# Patient Record
Sex: Male | Born: 1975 | Race: Black or African American | Hispanic: No | Marital: Single | Smoking: Former smoker
Health system: Southern US, Community
[De-identification: ages and names within clinical notes are randomized; demographics above are authoritative.]

---

## 2010-07-04 ENCOUNTER — Emergency Department: Payer: Self-pay | Admitting: Emergency Medicine

## 2011-06-18 ENCOUNTER — Ambulatory Visit: Payer: Self-pay | Admitting: Internal Medicine

## 2013-01-21 HISTORY — PX: KNEE SURGERY: SHX244

## 2013-01-21 HISTORY — PX: LEG SURGERY: SHX1003

## 2015-08-11 ENCOUNTER — Inpatient Hospital Stay
Admission: EM | Admit: 2015-08-11 | Discharge: 2015-08-17 | DRG: 465 | Disposition: A | Discharge status: Home Health | Payer: Self-pay | Attending: Orthopedic Surgery | Admitting: Orthopedic Surgery

## 2015-08-11 ENCOUNTER — Encounter: Payer: Self-pay | Admitting: Emergency Medicine

## 2015-08-11 ENCOUNTER — Emergency Department: Payer: Self-pay

## 2015-08-11 DIAGNOSIS — B9561 Methicillin susceptible Staphylococcus aureus infection as the cause of diseases classified elsewhere: Secondary | ICD-10-CM | POA: Diagnosis present

## 2015-08-11 DIAGNOSIS — M00261 Other streptococcal arthritis, right knee: Principal | ICD-10-CM | POA: Diagnosis present

## 2015-08-11 DIAGNOSIS — M009 Pyogenic arthritis, unspecified: Secondary | ICD-10-CM | POA: Diagnosis present

## 2015-08-11 DIAGNOSIS — Z95828 Presence of other vascular implants and grafts: Secondary | ICD-10-CM

## 2015-08-11 DIAGNOSIS — M25461 Effusion, right knee: Secondary | ICD-10-CM

## 2015-08-11 DIAGNOSIS — L97509 Non-pressure chronic ulcer of other part of unspecified foot with unspecified severity: Secondary | ICD-10-CM

## 2015-08-11 DIAGNOSIS — L97511 Non-pressure chronic ulcer of other part of right foot limited to breakdown of skin: Secondary | ICD-10-CM | POA: Diagnosis present

## 2015-08-11 DIAGNOSIS — G6289 Other specified polyneuropathies: Secondary | ICD-10-CM | POA: Diagnosis present

## 2015-08-11 DIAGNOSIS — B951 Streptococcus, group B, as the cause of diseases classified elsewhere: Secondary | ICD-10-CM | POA: Diagnosis present

## 2015-08-11 DIAGNOSIS — S83104S Unspecified dislocation of right knee, sequela: Secondary | ICD-10-CM

## 2015-08-11 LAB — CBC WITH DIFFERENTIAL/PLATELET
BASOS ABS: 0.1 10*3/uL (ref 0–0.1)
Basophils Relative: 1 %
EOS ABS: 0 10*3/uL (ref 0–0.7)
EOS PCT: 0 %
HCT: 46.1 % (ref 40.0–52.0)
HEMOGLOBIN: 16.2 g/dL (ref 13.0–18.0)
LYMPHS ABS: 1.2 10*3/uL (ref 1.0–3.6)
LYMPHS PCT: 16 %
MCH: 31.5 pg (ref 26.0–34.0)
MCHC: 35 g/dL (ref 32.0–36.0)
MCV: 89.8 fL (ref 80.0–100.0)
Monocytes Absolute: 1 10*3/uL (ref 0.2–1.0)
Monocytes Relative: 13 %
NEUTROS PCT: 70 %
Neutro Abs: 5.3 10*3/uL (ref 1.4–6.5)
PLATELETS: 220 10*3/uL (ref 150–440)
RBC: 5.13 MIL/uL (ref 4.40–5.90)
RDW: 13.6 % (ref 11.5–14.5)
WBC: 7.5 10*3/uL (ref 3.8–10.6)

## 2015-08-11 LAB — BASIC METABOLIC PANEL
ANION GAP: 11 (ref 5–15)
BUN: 10 mg/dL (ref 6–20)
CALCIUM: 9.7 mg/dL (ref 8.9–10.3)
CHLORIDE: 95 mmol/L — AB (ref 101–111)
CO2: 28 mmol/L (ref 22–32)
CREATININE: 0.98 mg/dL (ref 0.61–1.24)
GFR calc Af Amer: >60 mL/min (ref >60)
Glucose, Bld: 96 mg/dL (ref 65–99)
POTASSIUM: 4 mmol/L (ref 3.5–5.1)
SODIUM: 134 mmol/L — AB (ref 135–145)

## 2015-08-11 LAB — SEDIMENTATION RATE: SED RATE: 29 mm/h — AB (ref 0–15)

## 2015-08-11 LAB — C-REACTIVE PROTEIN: CRP: 26.2 mg/dL — AB (ref <1.0)

## 2015-08-11 LAB — LACTIC ACID, PLASMA: Lactic Acid, Venous: 1.1 mmol/L (ref 0.5–1.9)

## 2015-08-11 MED ORDER — MORPHINE SULFATE (PF) 4 MG/ML IV SOLN
4.0000 mg | Freq: Once | INTRAVENOUS | Status: AC
  Administered 2015-08-11: 4 mg via INTRAVENOUS
  Filled 2015-08-11: qty 1

## 2015-08-11 NOTE — ED Notes (Signed)
MD at bedside. 

## 2015-08-11 NOTE — ED Notes (Signed)
Patient transported to X-ray 
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
## 2015-08-11 NOTE — ED Notes (Signed)
EMS vitals were 101.3 temperature, 121/76, 78 HR, 10/10 pain.
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
## 2015-08-12 ENCOUNTER — Inpatient Hospital Stay: Payer: Self-pay | Admitting: Anesthesiology

## 2015-08-12 ENCOUNTER — Inpatient Hospital Stay: Payer: Self-pay

## 2015-08-12 ENCOUNTER — Encounter: Payer: Self-pay | Admitting: Anesthesiology

## 2015-08-12 ENCOUNTER — Encounter
Admission: EM | Disposition: A | Discharge status: Home Health | Payer: Self-pay | Source: Home / Self Care | Attending: Orthopedic Surgery

## 2015-08-12 DIAGNOSIS — M009 Pyogenic arthritis, unspecified: Secondary | ICD-10-CM | POA: Diagnosis present

## 2015-08-12 HISTORY — PX: INCISION AND DRAINAGE: SHX5863

## 2015-08-12 LAB — SYNOVIAL CELL COUNT + DIFF, W/ CRYSTALS
CRYSTALS FLUID: NONE SEEN
Eosinophils-Synovial: 0 %
Lymphocytes-Synovial Fld: 1 %
Monocyte-Macrophage-Synovial Fluid: 3 %
NEUTROPHIL, SYNOVIAL: 96 %
OTHER CELLS-SYN: 0
WBC, SYNOVIAL: 132682 /mm3 — AB (ref 0–200)

## 2015-08-12 SURGERY — INCISION AND DRAINAGE
Anesthesia: General | Laterality: Right

## 2015-08-12 MED ORDER — OXYCODONE HCL 5 MG PO TABS
10.0000 mg | ORAL_TABLET | ORAL | Status: DC | PRN
  Administered 2015-08-12 (×2): 15 mg via ORAL
  Administered 2015-08-12: 5 mg via ORAL
  Administered 2015-08-12: 10 mg via ORAL
  Administered 2015-08-13 – 2015-08-15 (×13): 15 mg via ORAL
  Administered 2015-08-15: 10 mg via ORAL
  Administered 2015-08-15 – 2015-08-17 (×13): 15 mg via ORAL
  Filled 2015-08-12 (×14): qty 3
  Filled 2015-08-12: qty 1
  Filled 2015-08-12 (×2): qty 3
  Filled 2015-08-12: qty 2
  Filled 2015-08-12 (×4): qty 3
  Filled 2015-08-12: qty 2
  Filled 2015-08-12: qty 1
  Filled 2015-08-12: qty 2
  Filled 2015-08-12 (×9): qty 3

## 2015-08-12 MED ORDER — PROPOFOL 10 MG/ML IV BOLUS
INTRAVENOUS | Status: DC | PRN
  Administered 2015-08-12: 50 mg via INTRAVENOUS
  Administered 2015-08-12: 150 mg via INTRAVENOUS
  Administered 2015-08-12: 100 mg via INTRAVENOUS

## 2015-08-12 MED ORDER — VANCOMYCIN HCL IN DEXTROSE 1-5 GM/200ML-% IV SOLN
1000.0000 mg | Freq: Three times a day (TID) | INTRAVENOUS | Status: DC
  Administered 2015-08-12 – 2015-08-17 (×16): 1000 mg via INTRAVENOUS
  Filled 2015-08-12 (×17): qty 200

## 2015-08-12 MED ORDER — ACETAMINOPHEN 325 MG PO TABS
650.0000 mg | ORAL_TABLET | Freq: Four times a day (QID) | ORAL | Status: DC | PRN
  Administered 2015-08-17: 650 mg via ORAL
  Filled 2015-08-12: qty 2

## 2015-08-12 MED ORDER — LIDOCAINE HCL (CARDIAC) 20 MG/ML IV SOLN
INTRAVENOUS | Status: DC | PRN
  Administered 2015-08-12: 50 mg via INTRAVENOUS

## 2015-08-12 MED ORDER — ACETAMINOPHEN 10 MG/ML IV SOLN
INTRAVENOUS | Status: AC
  Filled 2015-08-12: qty 100

## 2015-08-12 MED ORDER — FENTANYL CITRATE (PF) 100 MCG/2ML IJ SOLN
INTRAMUSCULAR | Status: DC | PRN
  Administered 2015-08-12 (×3): 50 ug via INTRAVENOUS

## 2015-08-12 MED ORDER — MAGNESIUM CITRATE PO SOLN
1.0000 | Freq: Once | ORAL | Status: AC | PRN
  Administered 2015-08-15: 1 via ORAL
  Filled 2015-08-12 (×2): qty 296

## 2015-08-12 MED ORDER — ONDANSETRON HCL 4 MG/2ML IJ SOLN: 4.0000 mg | Freq: Four times a day (QID) | INTRAMUSCULAR | Status: DC | PRN

## 2015-08-12 MED ORDER — NEOMYCIN-POLYMYXIN B GU 40-200000 IR SOLN
Status: AC
  Filled 2015-08-12: qty 20

## 2015-08-12 MED ORDER — LIDOCAINE 5 % EX PTCH
1.0000 | MEDICATED_PATCH | CUTANEOUS | Status: DC
  Administered 2015-08-13 – 2015-08-17 (×5): 1 via TRANSDERMAL
  Filled 2015-08-12 (×6): qty 1

## 2015-08-12 MED ORDER — HYDROMORPHONE HCL 1 MG/ML IJ SOLN
1.0000 mg | INTRAMUSCULAR | Status: DC | PRN
  Administered 2015-08-12 – 2015-08-17 (×9): 1 mg via INTRAVENOUS
  Filled 2015-08-12 (×10): qty 1

## 2015-08-12 MED ORDER — ACETAMINOPHEN 325 MG PO TABS: 650.0000 mg | ORAL_TABLET | Freq: Four times a day (QID) | ORAL | Status: DC | PRN

## 2015-08-12 MED ORDER — BISACODYL 10 MG RE SUPP: 10.0000 mg | Freq: Every day | RECTAL | Status: DC | PRN

## 2015-08-12 MED ORDER — PIPERACILLIN-TAZOBACTAM 3.375 G IVPB
3.3750 g | Freq: Three times a day (TID) | INTRAVENOUS | Status: DC
  Administered 2015-08-12: 3.375 g via INTRAVENOUS
  Filled 2015-08-12 (×3): qty 50

## 2015-08-12 MED ORDER — METOCLOPRAMIDE HCL 10 MG PO TABS: 5.0000 mg | ORAL_TABLET | Freq: Three times a day (TID) | ORAL | Status: DC | PRN

## 2015-08-12 MED ORDER — DEXTROSE 5 % IV SOLN
2.0000 g | INTRAVENOUS | Status: DC
  Administered 2015-08-12 – 2015-08-17 (×6): 2 g via INTRAVENOUS
  Filled 2015-08-12 (×6): qty 2

## 2015-08-12 MED ORDER — VANCOMYCIN HCL IN DEXTROSE 1-5 GM/200ML-% IV SOLN
1000.0000 mg | INTRAVENOUS | Status: AC
  Administered 2015-08-12: 1000 mg via INTRAVENOUS
  Filled 2015-08-12: qty 200

## 2015-08-12 MED ORDER — POLYETHYLENE GLYCOL 3350 17 G PO PACK
17.0000 g | PACK | Freq: Every day | ORAL | Status: DC | PRN
Start: 2015-08-12 — End: 2015-08-17

## 2015-08-12 MED ORDER — DEXAMETHASONE SODIUM PHOSPHATE 10 MG/ML IJ SOLN
INTRAMUSCULAR | Status: DC | PRN
  Administered 2015-08-12: 5 mg via INTRAVENOUS

## 2015-08-12 MED ORDER — METOCLOPRAMIDE HCL 5 MG/ML IJ SOLN: 5.0000 mg | Freq: Three times a day (TID) | INTRAMUSCULAR | Status: DC | PRN

## 2015-08-12 MED ORDER — FENTANYL CITRATE (PF) 100 MCG/2ML IJ SOLN
INTRAMUSCULAR | Status: AC
  Filled 2015-08-12: qty 2

## 2015-08-12 MED ORDER — METHOCARBAMOL 1000 MG/10ML IJ SOLN
500.0000 mg | Freq: Four times a day (QID) | INTRAVENOUS | Status: DC | PRN
  Filled 2015-08-12: qty 5

## 2015-08-12 MED ORDER — SENNA 8.6 MG PO TABS
1.0000 | ORAL_TABLET | Freq: Two times a day (BID) | ORAL | Status: DC
  Administered 2015-08-12 – 2015-08-16 (×9): 8.6 mg via ORAL
  Filled 2015-08-12 (×10): qty 1

## 2015-08-12 MED ORDER — GLYCOPYRROLATE 0.2 MG/ML IJ SOLN
INTRAMUSCULAR | Status: DC | PRN
  Administered 2015-08-12: 0.2 mg via INTRAVENOUS

## 2015-08-12 MED ORDER — LACTATED RINGERS IV SOLN
INTRAVENOUS | Status: DC | PRN
  Administered 2015-08-12: 08:00:00 via INTRAVENOUS

## 2015-08-12 MED ORDER — VANCOMYCIN HCL IN DEXTROSE 1-5 GM/200ML-% IV SOLN
1000.0000 mg | Freq: Two times a day (BID) | INTRAVENOUS | Status: DC
  Filled 2015-08-12: qty 200

## 2015-08-12 MED ORDER — ENOXAPARIN SODIUM 40 MG/0.4ML ~~LOC~~ SOLN
40.0000 mg | SUBCUTANEOUS | Status: DC
  Administered 2015-08-13 – 2015-08-17 (×5): 40 mg via SUBCUTANEOUS
  Filled 2015-08-12 (×5): qty 0.4

## 2015-08-12 MED ORDER — ACETAMINOPHEN 650 MG RE SUPP: 650.0000 mg | Freq: Four times a day (QID) | RECTAL | Status: DC | PRN

## 2015-08-12 MED ORDER — ONDANSETRON HCL 4 MG PO TABS: 4.0000 mg | ORAL_TABLET | Freq: Four times a day (QID) | ORAL | Status: DC | PRN

## 2015-08-12 MED ORDER — NEOMYCIN-POLYMYXIN B GU 40-200000 IR SOLN
Status: DC | PRN
  Administered 2015-08-12: 28 mL

## 2015-08-12 MED ORDER — MORPHINE SULFATE (PF) 2 MG/ML IV SOLN
2.0000 mg | INTRAVENOUS | Status: DC | PRN
Start: 2015-08-12 — End: 2015-08-12
  Administered 2015-08-12: 2 mg via INTRAVENOUS
  Filled 2015-08-12: qty 1

## 2015-08-12 MED ORDER — MORPHINE SULFATE (PF) 4 MG/ML IV SOLN
4.0000 mg | Freq: Once | INTRAVENOUS | Status: AC
  Administered 2015-08-12: 4 mg via INTRAVENOUS
  Filled 2015-08-12: qty 1

## 2015-08-12 MED ORDER — DOCUSATE SODIUM 100 MG PO CAPS
100.0000 mg | ORAL_CAPSULE | Freq: Two times a day (BID) | ORAL | Status: DC
  Administered 2015-08-12 – 2015-08-16 (×9): 100 mg via ORAL
  Filled 2015-08-12 (×10): qty 1

## 2015-08-12 MED ORDER — DIPHENHYDRAMINE HCL 25 MG PO CAPS: 25.0000 mg | ORAL_CAPSULE | Freq: Four times a day (QID) | ORAL | Status: DC | PRN

## 2015-08-12 MED ORDER — FENTANYL CITRATE (PF) 100 MCG/2ML IJ SOLN
25.0000 ug | INTRAMUSCULAR | Status: DC | PRN
  Administered 2015-08-12 (×4): 25 ug via INTRAVENOUS

## 2015-08-12 MED ORDER — SODIUM CHLORIDE 0.9 % IV SOLN
INTRAVENOUS | Status: DC
  Administered 2015-08-12 – 2015-08-15 (×4): via INTRAVENOUS

## 2015-08-12 MED ORDER — VANCOMYCIN HCL IN DEXTROSE 1-5 GM/200ML-% IV SOLN: 1000.0000 mg | Freq: Two times a day (BID) | INTRAVENOUS | Status: DC

## 2015-08-12 MED ORDER — GABAPENTIN 300 MG PO CAPS
300.0000 mg | ORAL_CAPSULE | Freq: Three times a day (TID) | ORAL | Status: DC
  Administered 2015-08-12 – 2015-08-17 (×17): 300 mg via ORAL
  Filled 2015-08-12 (×17): qty 1

## 2015-08-12 MED ORDER — DIPHENHYDRAMINE HCL 12.5 MG/5ML PO ELIX: 12.5000 mg | ORAL_SOLUTION | ORAL | Status: DC | PRN

## 2015-08-12 MED ORDER — MIDAZOLAM HCL 2 MG/2ML IJ SOLN
INTRAMUSCULAR | Status: DC | PRN
  Administered 2015-08-12 (×2): 1 mg via INTRAVENOUS

## 2015-08-12 MED ORDER — ONDANSETRON HCL 4 MG/2ML IJ SOLN: 4.0000 mg | Freq: Once | INTRAMUSCULAR | Status: DC | PRN

## 2015-08-12 MED ORDER — ACETAMINOPHEN 10 MG/ML IV SOLN
INTRAVENOUS | Status: DC | PRN
  Administered 2015-08-12: 1000 mg via INTRAVENOUS

## 2015-08-12 MED ORDER — OXYCODONE HCL 5 MG PO TABS: 10.0000 mg | ORAL_TABLET | ORAL | Status: DC | PRN

## 2015-08-12 MED ORDER — METHOCARBAMOL 500 MG PO TABS
500.0000 mg | ORAL_TABLET | Freq: Four times a day (QID) | ORAL | Status: DC | PRN
  Administered 2015-08-14 – 2015-08-17 (×7): 500 mg via ORAL
  Filled 2015-08-12 (×7): qty 1

## 2015-08-12 SURGICAL SUPPLY — 52 items
BANDAGE ELASTIC 6 CLIP NS LF (GAUZE/BANDAGES/DRESSINGS) ×3 IMPLANT
BLADE SURG 11 STRL SS SAFETY (MISCELLANEOUS) ×6 IMPLANT
BNDG COHESIVE 6X5 TAN STRL LF (GAUZE/BANDAGES/DRESSINGS) ×3 IMPLANT
CANISTER SUCT 1200ML W/VALVE (MISCELLANEOUS) IMPLANT
CANISTER SUCT 3000ML (MISCELLANEOUS) ×9 IMPLANT
CAST PADDING 6X4YD ST 30248 (SOFTGOODS) ×2
CUFF TOURN 18 STER (MISCELLANEOUS) IMPLANT
CUFF TOURN 24 STER (MISCELLANEOUS) ×3 IMPLANT
DRAPE INCISE IOBAN 66X60 STRL (DRAPES) ×3 IMPLANT
DRAPE SHEET LG 3/4 BI-LAMINATE (DRAPES) ×3 IMPLANT
DRAPE SURG 17X11 SM STRL (DRAPES) IMPLANT
DRAPE U-SHAPE 47X51 STRL (DRAPES) ×3 IMPLANT
DURAPREP 26ML APPLICATOR (WOUND CARE) ×6 IMPLANT
ELECT CAUTERY BLADE 6.4 (BLADE) ×3 IMPLANT
ELECT REM PT RETURN 9FT ADLT (ELECTROSURGICAL) ×3
ELECTRODE REM PT RTRN 9FT ADLT (ELECTROSURGICAL) ×1 IMPLANT
GAUZE PETRO XEROFOAM 1X8 (MISCELLANEOUS) ×3 IMPLANT
GAUZE SPONGE 4X4 12PLY STRL (GAUZE/BANDAGES/DRESSINGS) ×3 IMPLANT
GAUZE XEROFORM 4X4 STRL (GAUZE/BANDAGES/DRESSINGS) ×6 IMPLANT
GLOVE BIOGEL PI IND STRL 9 (GLOVE) ×1 IMPLANT
GLOVE BIOGEL PI INDICATOR 9 (GLOVE) ×2
GLOVE SURG 9.0 ORTHO LTXF (GLOVE) ×9 IMPLANT
GOWN STRL REUS TWL 2XL XL LVL4 (GOWN DISPOSABLE) ×3 IMPLANT
GOWN STRL REUS W/ TWL LRG LVL3 (GOWN DISPOSABLE) ×1 IMPLANT
GOWN STRL REUS W/TWL LRG LVL3 (GOWN DISPOSABLE) ×2
HANDPIECE INTERPULSE COAX TIP (DISPOSABLE) ×2
HEMOVAC 400ML (MISCELLANEOUS)
IMMBOLIZER KNEE 19 BLUE UNIV (SOFTGOODS) ×3 IMPLANT
KIT DRAIN HEMOVAC JP 7FR 400ML (MISCELLANEOUS) IMPLANT
KIT RM TURNOVER STRD PROC AR (KITS) ×3 IMPLANT
NDL SAFETY 18GX1.5 (NEEDLE) ×3 IMPLANT
NEEDLE FILTER BLUNT 18X 1/2SAF (NEEDLE) ×2
NEEDLE FILTER BLUNT 18X1 1/2 (NEEDLE) ×1 IMPLANT
NS IRRIG 500ML POUR BTL (IV SOLUTION) ×3 IMPLANT
PACK EXTREMITY ARMC (MISCELLANEOUS) ×3 IMPLANT
PAD ABD DERMACEA PRESS 5X9 (GAUZE/BANDAGES/DRESSINGS) ×3 IMPLANT
PADDING CAST 4IN STRL (MISCELLANEOUS) ×4
PADDING CAST BLEND 4X4 STRL (MISCELLANEOUS) ×2 IMPLANT
PADDING CAST COTTON 6X4 ST (SOFTGOODS) ×1 IMPLANT
SET HNDPC FAN SPRY TIP SCT (DISPOSABLE) ×1 IMPLANT
SOL .9 NS 3000ML IRR  AL (IV SOLUTION) ×4
SOL .9 NS 3000ML IRR UROMATIC (IV SOLUTION) ×2 IMPLANT
SPONGE LAP 18X18 5 PK (GAUZE/BANDAGES/DRESSINGS) ×6 IMPLANT
STAPLER SKIN PROX 35W (STAPLE) ×6 IMPLANT
SUT ETHIBOND #5 BRAIDED 30INL (SUTURE) IMPLANT
SUT PDS AB 1 CT1 27 (SUTURE) ×6 IMPLANT
SUT TICRON 2-0 30IN 311381 (SUTURE) ×6 IMPLANT
SUT VIC AB 0 CT1 27 (SUTURE)
SUT VIC AB 0 CT1 27XCR 8 STRN (SUTURE) IMPLANT
SUT VIC AB 1 CTX 27 (SUTURE) IMPLANT
SYRINGE 10CC LL (SYRINGE) ×3 IMPLANT
TAPE MICROFOAM 4IN (TAPE) IMPLANT

## 2015-08-12 NOTE — Anesthesia Procedure Notes (Signed)
Procedure Name: LMA Insertion Performed by: Ginger Carne Pre-anesthesia Checklist: Patient identified, Emergency Drugs available, Suction available, Patient being monitored and Timeout performed Patient Re-evaluated:Patient Re-evaluated prior to inductionOxygen Delivery Method: Circle system utilized Preoxygenation: Pre-oxygenation with 100% oxygen Intubation Type: IV induction LMA: LMA inserted LMA Size: 4.5 Tube type: Oral Number of attempts: 1 Placement Confirmation: positive ETCO2 and breath sounds checked- equal and bilateral Tube secured with: Tape Dental Injury: Teeth and Oropharynx as per pre-operative assessment

## 2015-08-12 NOTE — Transfer of Care (Signed)
Immediate Anesthesia Transfer of Care Note  Patient: Nathaniel Rivers  Procedure(s) Performed: Procedure(s): INCISION AND DRAINAGE (Right)  Patient Location: PACU  Anesthesia Type:General  Level of Consciousness: sedated  Airway & Oxygen Therapy: Patient Spontanous Breathing and Patient connected to face mask oxygen  Post-op Assessment: Report given to RN and Post -op Vital signs reviewed and stable  Post vital signs: Reviewed and stable  Last Vitals:  Filed Vitals:   08/12/15 0426 08/12/15 1005  BP: 120/68 151/103  Pulse: 60 86  Temp: 37.7 C 36.1 C  Resp: 18 14    Last Pain:  Filed Vitals:   08/12/15 1006  PainSc: 0-No pain         Complications: No apparent anesthesia complications

## 2015-08-12 NOTE — OR Nursing (Signed)
Patient h appears to have left discomfort at this time

## 2015-08-12 NOTE — ED Provider Notes (Addendum)
-----------------------------------------   1:41 AM on 08/12/2015 -----------------------------------------   Blood pressure 117/80, pulse 65, temperature 100.2 F (37.9 C), temperature source Oral, resp. rate 18, height 5\' 10"  (1.778 m), weight 165 lb (74.844 kg), SpO2 95 %.  Assuming care from Dr. Darden Dates.  In short, Nathaniel Rivers is a 40 y.o. male with a chief complaint of Knee Pain .  Refer to the original H&P for additional details.  The current plan of care is to follow up the results of the cell count.  The patient's CRP returned at 26.2. The patient synovial fluid cell count and diff shows a turbid appearance with no crystal seen and WBC 132682.  I asked the patient where he had his previous surgeries and he said [facility] and [facility] but the patient at this time is refusing to go to [facility] and does not want to be transferred.   I contacted Dr. Martha Clan who reports that he will put in orders for the patient and will set him up for a wash out.    Rebecka Apley, MD 08/12/15 0144  Rebecka Apley, MD 08/12/15 [phone]
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
## 2015-08-12 NOTE — Progress Notes (Signed)
Pt refusing to have leg elevated on pillow.
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
## 2015-08-12 NOTE — Progress Notes (Signed)
Spoke with Dr. Ninetta Lights from infectious disease at [facility]. He is providing phone consultation but is not in-house. I discussed Mr. Guadiana's case with him. Dr. Ninetta Lights understands the patient just underwent open washout of the right knee. There was gross purulent fluid in the right knee. I requested antibiotic recommendations from Dr. Ninetta Lights. He has recommended vancomycin and ceftriaxone until final cultures are available. He states the patient will need long-term IV antibiotics and will need a PICC line. I will contact Dr. Ninetta Lights again after final cultures are available.

## 2015-08-12 NOTE — Anesthesia Preprocedure Evaluation (Signed)
Anesthesia Evaluation  Patient identified by MRN, date of birth, ID band Patient awake    Reviewed: Allergy & Precautions, NPO status , Patient's Chart, lab work & pertinent test results, reviewed documented beta blocker date and time   Airway Mallampati: II  TM Distance: >3 FB     Dental  (+) Chipped   Pulmonary former smoker,           Cardiovascular      Neuro/Psych    GI/Hepatic   Endo/Other    Renal/GU      Musculoskeletal   Abdominal   Peds  Hematology   Anesthesia Other Findings   Reproductive/Obstetrics                             Anesthesia Physical Anesthesia Plan  ASA: II  Anesthesia Plan: General   Post-op Pain Management:    Induction: Intravenous  Airway Management Planned: LMA  Additional Equipment:   Intra-op Plan:   Post-operative Plan:   Informed Consent: I have reviewed the patients History and Physical, chart, labs and discussed the procedure including the risks, benefits and alternatives for the proposed anesthesia with the patient or authorized representative who has indicated his/her understanding and acceptance.     Plan Discussed with: CRNA  Anesthesia Plan Comments:         Anesthesia Quick Evaluation  

## 2015-08-12 NOTE — Anesthesia Postprocedure Evaluation (Signed)
Anesthesia Post Note  Patient: Nathaniel Rivers  Procedure(s) Performed: Procedure(s) (LRB): INCISION AND DRAINAGE (Right)  Patient location during evaluation: PACU Anesthesia Type: General Level of consciousness: awake and alert Pain management: pain level controlled Vital Signs Assessment: post-procedure vital signs reviewed and stable Respiratory status: spontaneous breathing, nonlabored ventilation, respiratory function stable and patient connected to nasal cannula oxygen Cardiovascular status: blood pressure returned to baseline and stable Postop Assessment: no signs of nausea or vomiting Anesthetic complications: no    Last Vitals:  Filed Vitals:   08/12/15 1455 08/12/15 1543  BP: 108/76 105/64  Pulse: 50 52  Temp: 36.7 C 36.5 C  Resp: 18 18    Last Pain:  Filed Vitals:   08/12/15 1544  PainSc: 7                  Rishika Mccollom S

## 2015-08-12 NOTE — Progress Notes (Signed)
Pt sent to the OR via bed by OR orderly
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
## 2015-08-12 NOTE — Progress Notes (Signed)
Pharmacy Antibiotic Note  Nathaniel Rivers is a 40 y.o. male admitted on 08/11/2015 with Septic joint .  Pharmacy has been consulted for vancomcyin dosing. Patient is also receiving ceftriaxone daily per ID recommendation until cultures are available.   Plan: Ke: 0.092   T1/2:  7.53   Vd: 52.5   CrCl: 153ml/min   Scr: 0.95  Goal Trough 15-20  Will start patient on Vancomycin 1gm IV every 8 hours. Calculated trough at Css is 17.3. Will order trough level prior to 5th dose and continue to monitor renal function.    Height: 5\' 10"  (177.8 cm) Weight: 165 lb (74.844 kg) IBW/kg (Calculated) : 73  Temp (24hrs), Avg:99.2 F (37.3 C), Min:97 F (36.1 C), Max:100.2 F (37.9 C)   Recent Labs Lab 08/11/15 2112 08/11/15 2135  WBC  --  7.5  CREATININE  --  0.98  LATICACIDVEN 1.1  --     Estimated Creatinine Clearance: 106.6 mL/min (by C-G formula based on Cr of 0.98).    No Known Allergies  Antimicrobials this admission: 7/22 Vancomycin  >>  7/22 Ceftriaxone >>   Dose adjustments this admission:   Microbiology results: Body fluid Cx: pending  Aerobic/Anaerobic wound culture: pending   Thank you for allowing pharmacy to be a part of this patient's care.  Cher Nakai, PharmD Clinical Pharmacist 08/12/2015 11:37 AM
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
## 2015-08-13 LAB — COMPREHENSIVE METABOLIC PANEL
ALBUMIN: 3.1 g/dL — AB (ref 3.5–5.0)
ALK PHOS: 61 U/L (ref 38–126)
ALT: 15 U/L — ABNORMAL LOW (ref 17–63)
ANION GAP: 6 (ref 5–15)
AST: 21 U/L (ref 15–41)
BUN: 7 mg/dL (ref 6–20)
CALCIUM: 9.1 mg/dL (ref 8.9–10.3)
CHLORIDE: 97 mmol/L — AB (ref 101–111)
CO2: 31 mmol/L (ref 22–32)
Creatinine, Ser: 0.82 mg/dL (ref 0.61–1.24)
GFR calc non Af Amer: >60 mL/min (ref >60)
GLUCOSE: 122 mg/dL — AB (ref 65–99)
POTASSIUM: 4.7 mmol/L (ref 3.5–5.1)
SODIUM: 134 mmol/L — AB (ref 135–145)
Total Bilirubin: 1 mg/dL (ref 0.3–1.2)
Total Protein: 7.2 g/dL (ref 6.5–8.1)

## 2015-08-13 LAB — VANCOMYCIN, TROUGH: Vancomycin Tr: 14 ug/mL — ABNORMAL LOW (ref 15–20)

## 2015-08-13 MED ORDER — COLLAGENASE 250 UNIT/GM EX OINT
TOPICAL_OINTMENT | Freq: Every day | CUTANEOUS | Status: DC
  Administered 2015-08-13 – 2015-08-14 (×2): via TOPICAL
  Administered 2015-08-15: 1 via TOPICAL
  Administered 2015-08-16 – 2015-08-17 (×2): via TOPICAL
  Filled 2015-08-13: qty 30

## 2015-08-13 NOTE — NC FL2 (Signed)
[place] MEDICAID FL2 LEVEL OF CARE SCREENING TOOL     IDENTIFICATION  Patient Name: Nathaniel Rivers Birthdate: 1975-12-14 Sex: male Admission Date (Current Location): 08/11/2015  [place] and [place] Number:  Chiropodist and Address:  [facility], [address] [place], [place] [address]      Provider Number: [id number]  Attending Physician Name and Address:  Juanell Fairly, MD  Relative Name and Phone Number:       Current Level of Care: Hospital Recommended Level of Care: Skilled Nursing Facility Prior Approval Number:    Date Approved/Denied:   PASRR Number:  [id number])  Discharge Plan: SNF    Current Diagnoses: Patient Active Problem List   Diagnosis Date Noted  . Septic joint of right knee joint (HCC) 08/12/2015    Orientation RESPIRATION BLADDER Height & Weight     Self, Time, Situation, Place  Normal Continent Weight: 165 lb (74.8 kg) Height:  5\' 10"  (177.8 cm)  BEHAVIORAL SYMPTOMS/MOOD NEUROLOGICAL BOWEL NUTRITION STATUS   (None)  (None) Continent Diet (Regular)  AMBULATORY STATUS COMMUNICATION OF NEEDS Skin   Extensive Assist Verbally Other (Comment) (Closed Incision- Right Leg; Closed Incision- Right Toe; Closed System Drain- Right Knee Accordion (Hemovac) 10 Fr. )                       Personal Care Assistance Level of Assistance  Bathing, Feeding, Dressing Bathing Assistance: Limited assistance Feeding assistance: Independent Dressing Assistance: Limited assistance     Functional Limitations Info  Sight, Hearing, Speech Sight Info: Adequate Hearing Info: Adequate Speech Info: Adequate    SPECIAL CARE FACTORS FREQUENCY  PT (By licensed PT)     PT Frequency:  (5)              Contractures      Additional Factors Info  Code Status, Allergies Code Status Info:  (Full Code) Allergies Info:  (No Known Allergies)           Current Medications (08/13/2015):  This is the current hospital  active medication list Current Facility-Administered Medications  Medication Dose Route Frequency Provider Last Rate Last Dose  . 0.9 %  sodium chloride infusion   Intravenous Continuous Juanell Fairly, MD 75 mL/hr at 08/12/15 1723    . acetaminophen (TYLENOL) tablet 650 mg  650 mg Oral Q6H PRN Juanell Fairly, MD       Or  . acetaminophen (TYLENOL) suppository 650 mg  650 mg Rectal Q6H PRN Juanell Fairly, MD      . bisacodyl (DULCOLAX) suppository 10 mg  10 mg Rectal Daily PRN Juanell Fairly, MD      . cefTRIAXone (ROCEPHIN) 2 g in dextrose 5 % 50 mL IVPB  2 g Intravenous Q24H Juanell Fairly, MD   2 g at 08/13/15 1049  . collagenase (SANTYL) ointment   Topical Daily Gwyneth Revels, DPM      . diphenhydrAMINE (BENADRYL) 12.5 MG/5ML elixir 12.5-25 mg  12.5-25 mg Oral Q4H PRN Juanell Fairly, MD      . docusate sodium (COLACE) capsule 100 mg  100 mg Oral BID Juanell Fairly, MD   100 mg at 08/13/15 0934  . enoxaparin (LOVENOX) injection 40 mg  40 mg Subcutaneous Q24H Juanell Fairly, MD   40 mg at 08/13/15 0742  . gabapentin (NEURONTIN) capsule 300 mg  300 mg Oral TID Juanell Fairly, MD   300 mg at 08/13/15 0934  . HYDROmorphone (DILAUDID) injection 1 mg  1 mg Intravenous  Q2H PRN Kevin Krasinski, MD   1 mg at 08/13/15 0742  . lidocaine (LIDODERM) 5 % 1 patch  Juanell Fairlysdermal Q24H Juanell Fairly, MD   1 patch at 08/13/15 1159  . magnesium citrate solution 1 Bottle  1 Bottle Oral Once PRN Juanell Fairly, MD      . methocarbamol (ROBAXIN) tablet 500 mg  500 mg Oral Q6H PRN Juanell Fairly, MD       Or  . methocarbamol (ROBAXIN) 500 mg in dextrose 5 % 50 mL IVPB  500 mg Intravenous Q6H PRN Juanell Fairly, MD      . metoCLOPramide (REGLAN) tablet 5-10 mg  5-10 mg Oral Q8H PRN Juanell Fairly, MD       Or  . metoCLOPramide (REGLAN) injection 5-10 mg  5-10 mg Intravenous Q8H PRN Juanell Fairly, MD      . ondansetron [facility]) tablet 4 mg  4 mg Oral Q6H PRN Juanell Fairly, MD       Or  .  ondansetron [facility]) injection 4 mg  4 mg Intravenous Q6H PRN Juanell Fairly, MD      . oxyCODONE (Oxy IR/ROXICODONE) immediate release tablet 10-15 mg  10-15 mg Oral Q3H PRN Juanell Fairly, MD   15 mg at 08/13/15 1007  . polyethylene glycol (MIRALAX / GLYCOLAX) packet 17 g  17 g Oral Daily PRN Juanell Fairly, MD      . senna [facility]) tablet 8.6 mg  1 tablet Oral BID Juanell Fairly, MD   8.6 mg at 08/13/15 0934  . vancomycin (VANCOCIN) IVPB 1000 mg/200 mL premix  1,000 mg Intravenous Q8H Sheema M Hallaji, RPH   1,000 mg at 08/13/15 1159     Discharge Medications: Please see discharge summary for a list of discharge medications.  Relevant Imaging Results:  Relevant Lab Results:   Additional Information  (SSN [id number])  Verta Ellen Dorion Petillo, LCSW
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
## 2015-08-13 NOTE — Progress Notes (Signed)
F/U right great toe ulcer. No breakthrough bleeding to bandage. Xray negative for osteomyelitis.  Low likelihood for osteo at this time. Will order santyl dressing and instructed pt to perform this at home. F/U with me in 2 weeks outpt clinic.
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
## 2015-08-13 NOTE — Progress Notes (Addendum)
Pharmacy Antibiotic Note  Nathaniel Rivers is a 40 y.o. male admitted on 08/11/2015 with Septic joint .  Pharmacy has been consulted for vancomcyin dosing. Patient is also receiving ceftriaxone daily per ID recommendation until cultures are available.   Plan: Ke: 0.092   T1/2:  7.53   Vd: 52.5   CrCl: 127ml/min   Scr: 0.95  Goal Trough 15-20  Will start patient on Vancomycin 1gm IV every 8 hours. Calculated trough at Css is 17.3. Will order trough level prior to 5th dose and continue to monitor renal function.   7/23 19:30 Vanc trough resulted at 3mcg/ml, will continue with current dosing for now.  Height: 5\' 10"  (177.8 cm) Weight: 165 lb (74.8 kg) IBW/kg (Calculated) : 73  Temp (24hrs), Avg:99 F (37.2 C), Min:98.5 F (36.9 C), Max:99.6 F (37.6 C)   Recent Labs Lab 08/11/15 2112 08/11/15 2135 08/13/15 0518 08/13/15 1925  WBC  --  7.5  --   --   CREATININE  --  0.98 0.82  --   LATICACIDVEN 1.1  --   --   --   VANCOTROUGH  --   --   --  14*    Estimated Creatinine Clearance: 127.4 mL/min (by C-G formula based on SCr of 0.82 mg/dL).    No Known Allergies  Antimicrobials this admission: 7/22 Vancomycin  >>  7/22 Ceftriaxone >>   Dose adjustments this admission:   Microbiology results: Body fluid Cx: pending  Aerobic/Anaerobic wound culture: pending   Thank you for allowing pharmacy to be a part of this patient's care.  Clovia Cuff, PharmD, BCPS 08/13/2015 9:10 PM

## 2015-08-14 ENCOUNTER — Encounter: Payer: Self-pay | Admitting: Orthopedic Surgery

## 2015-08-14 ENCOUNTER — Inpatient Hospital Stay: Payer: Self-pay

## 2015-08-14 LAB — COMPREHENSIVE METABOLIC PANEL
ALBUMIN: 3.2 g/dL — AB (ref 3.5–5.0)
ALT: 22 U/L (ref 17–63)
AST: 32 U/L (ref 15–41)
Alkaline Phosphatase: 66 U/L (ref 38–126)
Anion gap: 6 (ref 5–15)
BUN: 7 mg/dL (ref 6–20)
CHLORIDE: 96 mmol/L — AB (ref 101–111)
CO2: 32 mmol/L (ref 22–32)
Calcium: 9.1 mg/dL (ref 8.9–10.3)
Creatinine, Ser: 0.94 mg/dL (ref 0.61–1.24)
GFR calc Af Amer: >60 mL/min (ref >60)
GLUCOSE: 129 mg/dL — AB (ref 65–99)
POTASSIUM: 4.2 mmol/L (ref 3.5–5.1)
Sodium: 134 mmol/L — ABNORMAL LOW (ref 135–145)
Total Bilirubin: 1.2 mg/dL (ref 0.3–1.2)
Total Protein: 7.1 g/dL (ref 6.5–8.1)

## 2015-08-14 LAB — BODY FLUID CULTURE

## 2015-08-14 NOTE — Evaluation (Signed)
Occupational Therapy Evaluation Patient Details Name: Nathaniel Rivers MRN: [id number] DOB: 10/05/1975 Today's Date: 08/14/2015    History of Present Illness Patient is a 40 y.o. male admitted on 12 August 2015 with edema and swelling in the right knee. Pt. wears an AFO at home, however has rubbed a sore area on this right big toe. Pt. has a RW/manual wheelchair at home.   Clinical Impression   Pt. Is a 40 y.o. Male who was admitted to [facility] with edema and swelling in the right knee. Pt. presents with pain in the right knee, weakness, and decreased functional mobility for ADLs which hinder his ability to complete ADL tasks. Pt. could benefit from skilled OT services for ADL and IADL training, A/E training, UE there. Ex, functional mobility for ADLs, and home modifications in order to improve ADL and IADL functioning to return to his PLOF.    Follow Up Recommendations  No OT follow up    Equipment Recommendations  Tub/shower bench    Recommendations for Other Services Rehab consult     Precautions / Restrictions Precautions Precautions: Fall Required Braces or Orthoses: Knee Immobilizer - Right Knee Immobilizer - Right: On at all times Restrictions Weight Bearing Restrictions: No      Mobility                                    General transfer comment: Per PT pt. is ModA sit to stand, and mIn guard SPT.    Balance                                            ADL Overall ADL's : Needs assistance/impaired Eating/Feeding: Set up   Grooming: Set up               Lower Body Dressing: Maximal assistance (RLE, Pt. is indpendent with LLE.)                       Vision     Perception     Praxis      Pertinent Vitals/Pain Pain Assessment: 0-10 Pain Score: 10-Worst pain ever Pain Location: Right knee Pain Intervention(s): Limited activity within patient's tolerance     Hand Dominance     Extremity/Trunk Assessment Upper  Extremity Assessment Upper Extremity Assessment: Overall WFL for tasks assessed     Communication     Cognition                           General Comments       Exercises       Shoulder Instructions      Home Living Family/patient expects to be discharged to:: Private residence Living Arrangements: Alone Available Help at Discharge: Neighbor;Available PRN/intermittently Type of Home: House Home Access: Level entry     Home Layout: One level     Bathroom Shower/Tub: Tub/shower unit Shower/tub characteristics: Engineer, building services: Standard Bathroom Accessibility: Yes How Accessible: Accessible via walker Home Equipment: Walker - 2 wheels;Walker - standard;Wheelchair - manual          Prior Functioning/Environment Level of Independence: Needs assistance  Gait / Transfers Assistance Needed: Patient reports modified independence for household ambulation with walker/wheelchair. ADL's / Homemaking Assistance Needed: Reports modified independence; calls neighbor  for assistance when necessary for IADLs out of the home.        OT Diagnosis: Generalized weakness   OT Problem List: Decreased strength;Decreased safety awareness;Pain;Decreased knowledge of use of DME or AE   OT Treatment/Interventions: Self-care/ADL training;Therapeutic exercise;Patient/family education;DME and/or AE instruction;Therapeutic activities    OT Goals(Current goals can be found in the care plan section) Acute Rehab OT Goals Patient Stated Goal: Pt. be able to walk again. OT Goal Formulation: With patient  OT Frequency: Min 1X/week   Barriers to D/C: Inaccessible home environment          Co-evaluation              End of Session    Activity Tolerance: Patient tolerated treatment well Patient left: in bed;with call bell/phone within reach;with bed alarm set   Time: 0935-1005 OT Time Calculation (min): 30 min Charges:  OT General Charges $OT Visit: 1  Procedure OT Evaluation $OT Eval Moderate Complexity: 1 Procedure OT Treatments $Self Care/Home Management : 8-22 mins G-Codes:    Olegario Messier, MS, OTR/L Olegario Messier 08/14/2015, 10:30 AM

## 2015-08-14 NOTE — Progress Notes (Signed)
Physical Therapy Treatment Patient Details Name: Nathaniel Rivers MRN: [id number] DOB: 03/31/1975 Today's Date: 08/14/2015    History of Present Illness Patient is a 40 y.o. male admitted on 12 August 2015 with edema and swelling in the right knee. Pt. wears an AFO at home, however has rubbed a sore area on this right big toe. Pt. has a RW/manual wheelchair at home.    PT Comments    Pt in bed willing to participate in session.  Participated in exercises as described below.  He transitioned to edge of bed with min assist to manage LE.  He stood with min a x 1.  He was able to hop forward and back 2' in room - self limiting distances.  Pt is min guard with hopping.  He does not put his foot down due to pain.  He reports being confident on his walker but declined further ambulation stating "I could do it if it didn't hurt so bad".  He has an AFO but it is at home and states he could not use it at this time due to pain/edema.  Reports not having stairs to enter his home.  Bedroom is on second story but he is sleeping downstairs at this time.   Follow Up Recommendations   HHPT        Equipment Recommendations       Recommendations for Other Services       Precautions / Restrictions Precautions Precautions: Fall Required Braces or Orthoses: Knee Immobilizer - Right Knee Immobilizer - Right: On at all times Restrictions Weight Bearing Restrictions: No    Mobility  Bed Mobility Overal bed mobility: Needs Assistance Bed Mobility: Supine to Sit;Sit to Supine   Sidelying to sit: Min assist (le management) Supine to sit: Min assist Sit to supine:  Agricultural consultant)      Transfers Overall transfer level: Needs assistance Equipment used: Rolling walker (2 wheeled) Transfers: Sit to/from Stand Sit to Stand: Min assist         General transfer comment: Per PT pt. is ModA sit to stand, and mIn guard SPT.  Ambulation/Gait Ambulation/Gait assistance: Min guard Ambulation Distance  (Feet): 4 Feet ( x 2 - 2 feet forward, 2 backwards) Assistive device: Rolling walker (2 wheeled) Gait Pattern/deviations: Step-to pattern   Gait velocity interpretation: Below normal speed for age/gender General Gait Details: does not bear weight on RLE despite no WB restrictions by MD   Stairs            Wheelchair Mobility    Modified Rankin (Stroke Patients Only)       Balance Overall balance assessment: Needs assistance Sitting-balance support: Feet supported Sitting balance-Leahy Scale: Good     Standing balance support: Bilateral upper extremity supported Standing balance-Leahy Scale: Fair                      Cognition Arousal/Alertness: Awake/alert Behavior During Therapy: WFL for tasks assessed/performed Overall Cognitive Status: Within Functional Limits for tasks assessed                      Exercises General Exercises - Lower Extremity Quad Sets: AROM;Right;10 reps;Supine Gluteal Sets: AROM;Both;10 reps;Supine Hip ABduction/ADduction: AAROM;Right;10 reps;Supine Straight Leg Raises: AAROM;Right;10 reps;Supine    General Comments        Pertinent Vitals/Pain Pain Assessment: 0-10 Pain Score: 8  Pain Location: R knee Pain Descriptors / Indicators: Aching Pain Intervention(s): Limited activity within patient's tolerance    Home  Living Family/patient expects to be discharged to:: Private residence Living Arrangements: Alone Available Help at Discharge: Neighbor;Available PRN/intermittently Type of Home: House Home Access: Level entry   Home Layout: One level Home Equipment: Walker - 2 wheels;Walker - standard;Wheelchair - manual      Prior Function Level of Independence: Needs assistance  Gait / Transfers Assistance Needed: Patient reports modified independence for household ambulation with walker/wheelchair. ADL's / Homemaking Assistance Needed: Reports modified independence; calls neighbor for assistance when necessary for  IADLs out of the home.     PT Goals (current goals can now be found in the care plan section) Acute Rehab PT Goals Patient Stated Goal: Pt. be able to walk again. Progress towards PT goals: Progressing toward goals    Frequency  7X/week    PT Plan Current plan remains appropriate    Co-evaluation             End of Session Equipment Utilized During Treatment: Gait belt;Right knee immobilizer Activity Tolerance: Patient tolerated treatment well;Patient limited by pain Patient left: in bed;with call bell/phone within reach;with bed alarm set     Time: 0768-0881 PT Time Calculation (min) (ACUTE ONLY): 23 min  Charges:  $Gait Training: 8-22 mins $Therapeutic Exercise: 8-22 mins                    G Codes:      Danielle Dess, PTA 08/14/15, 12:03 PM

## 2015-08-14 NOTE — Progress Notes (Signed)
TC To ortho MD to clarify PICC line order, proceed with order, consent obtained
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
## 2015-08-14 NOTE — Care Management Note (Signed)
Case Management Note  Patient Details  Name: Nathaniel Rivers MRN: [id number] Date of Birth: 1975/04/05  Subjective/Objective:   Spoke with the patient for discharge planning. Patient will be discharged to home and will require IVABX possibly/. He will be getting a PICC line. Patient has walker cane and WC. Will ask for charitable foundation to cover cost of ABX when ID gives recommendations, Patient is self payer. Also will need [facility] RN, will ask [facility] for approval. Patient will need PCP.             Action/Plan: Home with [facility]  Expected Discharge Date:  08/15/15               Expected Discharge Plan:  Home w Home Health Services  In-House Referral:     Discharge planning Services  CM Consult, Medication Assistance, Indigent Health Clinic  Post Acute Care Choice:    Choice offered to:     DME Arranged:    DME Agency:  [facility]  HH Arranged:  RN [facility] Agency:  [facility]  Status of Service:  In process, will continue to follow  If discussed at Long Length of Stay Meetings, dates discussed:    Additional Comments:  Adonis Huguenin, RN 08/14/2015, 1:41 PM

## 2015-08-15 LAB — CBC
HEMATOCRIT: 41.4 % (ref 40.0–52.0)
Hemoglobin: 13.8 g/dL (ref 13.0–18.0)
MCH: 30.8 pg (ref 26.0–34.0)
MCHC: 33.4 g/dL (ref 32.0–36.0)
MCV: 92.1 fL (ref 80.0–100.0)
PLATELETS: 339 10*3/uL (ref 150–440)
RBC: 4.5 MIL/uL (ref 4.40–5.90)
RDW: 13.8 % (ref 11.5–14.5)
WBC: 8 10*3/uL (ref 3.8–10.6)

## 2015-08-15 LAB — SEDIMENTATION RATE: Sed Rate: 67 mm/hr — ABNORMAL HIGH (ref 0–15)

## 2015-08-15 LAB — COMPREHENSIVE METABOLIC PANEL
ALBUMIN: 3.1 g/dL — AB (ref 3.5–5.0)
ALK PHOS: 67 U/L (ref 38–126)
ALT: 21 U/L (ref 17–63)
AST: 21 U/L (ref 15–41)
Anion gap: 6 (ref 5–15)
BILIRUBIN TOTAL: 1.7 mg/dL — AB (ref 0.3–1.2)
BUN: 6 mg/dL (ref 6–20)
CALCIUM: 9.1 mg/dL (ref 8.9–10.3)
CO2: 32 mmol/L (ref 22–32)
CREATININE: 0.88 mg/dL (ref 0.61–1.24)
Chloride: 97 mmol/L — ABNORMAL LOW (ref 101–111)
GFR calc Af Amer: >60 mL/min (ref >60)
GLUCOSE: 136 mg/dL — AB (ref 65–99)
Potassium: 4.3 mmol/L (ref 3.5–5.1)
Sodium: 135 mmol/L (ref 135–145)
TOTAL PROTEIN: 7 g/dL (ref 6.5–8.1)

## 2015-08-15 NOTE — Progress Notes (Signed)
Physical Therapy Treatment Patient Details Name: Nathaniel Rivers MRN: [id number] DOB: 03/11/1975 Today's Date: 08/15/2015    History of Present Illness Patient is a 40 y.o. male admitted on 12 August 2015 with edema and swelling in the right knee. Pt. wears an AFO at home, however has rubbed a sore area on this right big toe. Pt. has a RW/manual wheelchair at home.    PT Comments    Pt agreeable to PT; notes painful right knee/lower extremity especially in dependent position. Post short sit, pt wishes to defer walking due to throbbing pain in Right lower extremity. Pt participates well and educated in supine and stand exercises with written instructions provided. Educated as well in carryover to home. Continue PT to progress strength to allow for improved functional mobility to ensure optimal/safe return home.   Follow Up Recommendations  Outpatient PT     Equipment Recommendations  None recommended by PT    Recommendations for Other Services       Precautions / Restrictions Precautions Precautions: Fall Required Braces or Orthoses: Knee Immobilizer - Right Knee Immobilizer - Right: On at all times Restrictions Weight Bearing Restrictions: No    Mobility  Bed Mobility Overal bed mobility: Needs Assistance Bed Mobility: Supine to Sit;Sit to Supine   Sidelying to sit: Min assist Supine to sit: Min assist Sit to supine: Min assist   General bed mobility comments: Assist for RLE only  Transfers Overall transfer level: Needs assistance Equipment used: Rolling walker (2 wheeled) Transfers: Sit to/from Stand Sit to Stand: Min assist;From elevated surface         General transfer comment: slightly elevated bed for easier elevation and mobilization of RLE into extension to limit pain. Pt has immediate increased pain/throbbing RLE with stand and prefers no further stand/ambulation to avoid increased pain  Ambulation/Gait             General Gait Details:  Refused   Stairs            Wheelchair Mobility    Modified Rankin (Stroke Patients Only)       Balance Overall balance assessment: Needs assistance   Sitting balance-Leahy Scale: Good Sitting balance - Comments: prefers RLE elevated due to throbbing pain in dependent position   Standing balance support: Bilateral upper extremity supported Standing balance-Leahy Scale: Fair Standing balance comment: Throbbing pain in RLE                     Cognition Arousal/Alertness: Awake/alert Behavior During Therapy: WFL for tasks assessed/performed Overall Cognitive Status: Within Functional Limits for tasks assessed                      Exercises General Exercises - Lower Extremity Ankle Circles/Pumps: AROM;Left;20 reps;Supine Quad Sets: Strengthening;Both;Supine;10 reps (2 sets) Short Arc Quad: AROM;Left;20 reps;Supine Heel Slides: Strengthening;Left;10 reps;Supine (2 sets) Hip ABduction/ADduction: AAROM;Right;10 reps;Supine (L AROM 10x 2 sets) Straight Leg Raises: Strengthening;Left;10 reps;Supine (2 sets with abdominal brace. Attempted R; too painful with A) Other Exercises Other Exercises: Bridge using LLE in hooklying Other Exercises: PT educated on stand exercises with demonstration and written instructions provided for home use Other Exercises: Written instructions provided for supine exercises Other Exercises: Pt educated on repetitions, sets, frequency and progression/regression of exercises for home use.    General Comments        Pertinent Vitals/Pain Pain Assessment: 0-10 Pain Score: 6  Pain Location: R knee/LE Pain Descriptors / Indicators: Throbbing Pain Intervention(s): Limited activity  within patient's tolerance;Monitored during session;Premedicated before session    Home Living Family/patient expects to be discharged to:: Private residence Living Arrangements: Alone Available Help at Discharge: Neighbor;Available  PRN/intermittently Type of Home: House Home Access: Level entry   Home Layout: Two level;Able to live on main level with bedroom/bathroom        Prior Function            PT Goals (current goals can now be found in the care plan section) Acute Rehab PT Goals Patient Stated Goal: Pt. be able to walk again. Progress towards PT goals: Progressing toward goals (slowly)    Frequency  7X/week    PT Plan Current plan remains appropriate    Co-evaluation             End of Session Equipment Utilized During Treatment: Gait belt;Right knee immobilizer Activity Tolerance: Patient limited by fatigue;Patient limited by pain;Patient tolerated treatment well Patient left: in bed;with call bell/phone within reach;with bed alarm set     Time: 1003-1039 PT Time Calculation (min) (ACUTE ONLY): 36 min  Charges:  $Therapeutic Exercise: 8-22 mins $Therapeutic Activity: 8-22 mins                    G Codes:      Kristeen Miss, PTA 08/15/2015, 1:17 PM
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
## 2015-08-15 NOTE — Progress Notes (Signed)
Pt with good appetite. Dr Martha Clan reports d/c ivf

## 2015-08-16 LAB — VANCOMYCIN, TROUGH: VANCOMYCIN TR: 14 ug/mL — AB (ref 15–20)

## 2015-08-16 LAB — C-REACTIVE PROTEIN: CRP: 19.2 mg/dL — ABNORMAL HIGH (ref <1.0)
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
## 2015-08-16 NOTE — Progress Notes (Signed)
Pharmacy Antibiotic Note  Nathaniel Rivers is a 40 y.o. male admitted on 08/11/2015 with Septic joint .  Pharmacy has been consulted for vancomcyin dosing. Patient is also receiving ceftriaxone daily per ID recommendation until cultures are available.   Plan: Ke: 0.092   T1/2:  7.53   Vd: 52.5   CrCl: 175ml/min   Scr: 0.95  Goal Trough 15-20  Will start patient on Vancomycin 1gm IV every 8 hours. Calculated trough at Css is 17.3. Will order trough level prior to 5th dose and continue to monitor renal function.   7/23 19:30 Vanc trough resulted at 31mcg/ml, will continue with current dosing for now.\ 726 @ 10:57 Vancomycin trough level = 14 mcg/m. Will continue current dosing regimen.   Per MD:  Patient will should continue with physical therapy and should begin to bend his right knee. Possible discharge this evening if long-term antibiotics are selected by infectious disease service.  Height: 5\' 10"  (177.8 cm) Weight: 165 lb (74.8 kg) IBW/kg (Calculated) : 73  Temp (24hrs), Avg:99.2 F (37.3 C), Min:98.2 F (36.8 C), Max:100.2 F (37.9 C)   Recent Labs Lab 08/11/15 2112 08/11/15 2135 08/13/15 0518 08/13/15 1925 08/14/15 0428 08/15/15 0431 08/15/15 2014 08/16/15 1057  WBC  --  7.5  --   --   --   --  8.0  --   CREATININE  --  0.98 0.82  --  0.94 0.88  --   --   LATICACIDVEN 1.1  --   --   --   --   --   --   --   VANCOTROUGH  --   --   --  14*  --   --   --  14*    Estimated Creatinine Clearance: 118.7 mL/min (by C-G formula based on SCr of 0.88 mg/dL).    No Known Allergies  Antimicrobials this admission: 7/22 Vancomycin  >>  7/22 Ceftriaxone >>   Dose adjustments this admission:   Microbiology results: Body fluid Cx: pending  Aerobic/Anaerobic wound culture: pending   Thank you for allowing pharmacy to be a part of this patient's care.  Demetrius Charity, PharmD  08/16/2015 2:00 PM

## 2015-08-16 NOTE — Progress Notes (Signed)
Spoke with Dr. Drue Second today from infectious disease at [facility].  She has a consulting infectious disease specialist for [facility] Dr. Sampson Goon is away.  We have reviewed the patient's cultures. Patient is growing group B strep with rare staph aureus.  She has recommended Ancef 2 g every 8 hours for 4 weeks. The end date will be August 20. She has also requested that the home health agency drawn weekly labs including CBC, sedimentation rate, CRP, and basic metabolic profile.  These results should be faxed to Dr. Drue Second each week at [phone].  Patient will have home health infusion arranged tomorrow. He'll be discharged home tomorrow. Continue with physical therapy for now.

## 2015-08-16 NOTE — Progress Notes (Signed)
Occupational Therapy Treatment Patient Details Name: Yaroslav Gombos MRN: [id number] DOB: [birth year] Today's Date: 08/16/2015    History of present illness Patient is a 40 y.o. male admitted on 12 August 2015 with edema and swelling in the right knee. Pt. wears an AFO at home, however has rubbed a sore area on this right big toe. Pt. has a RW/manual wheelchair at home.   OT comments  Pt. Continues to present with impaired LE ADL functioning, pain,and functional mobility. Pt. Continues to benefit from skilled OT intervention for A/E training, ADL training, positioning, and functional mobility in order to regain independence with ADLs.. Pt. Presented with increased pain in the RLE which limited activity, and tolerance for activity is sitting. Pt. Is keeping RLE elevated. Pt. Expressed concern about managing at home with limited family support.    Follow Up Recommendations   (Home health care aide to assist with RLE ADLs.)    Equipment Recommendations  Tub/shower bench    Recommendations for Other Services PT consult    Precautions / Restrictions Precautions Precautions: Fall Required Braces or Orthoses: Knee Immobilizer - Right Knee Immobilizer - Right: On at all times Restrictions Weight Bearing Restrictions: No       Mobility Bed Mobility Overal bed mobility: Modified Independent         Balance                 ADL Overall ADL's : Needs assistance/impaired Eating/Feeding: Set up   Grooming: Set up               Lower Body Dressing:  (Min Assist with LLE, MaxA with RLE)   Toilet Transfer:  (Independent with urinal use)             General ADL Comments: Pt. was seen for a bedside tx. session secondary to having 6/10 pain at rest, and 10/10 pain with sitting at EOB following PT. Pt. education was provided about the A/E use for LE dressing. Pt. ed regarding home set-up.       Vision                     Perception     Praxis      Cognition    Behavior During Therapy: WFL for tasks assessed/performed Overall Cognitive Status: Within Functional Limits for tasks assessed                       Extremity/Trunk Assessment  Upper Extremity Assessment Upper Extremity Assessment: Overall WFL for tasks assessed            Exercises General Exercises - Lower Extremity Ankle Circles/Pumps: Left;20 reps;Supine;AROM (RLE PROM with strap) Quad Sets: Strengthening;Both;15 reps;Supine Gluteal Sets: Strengthening;Both;15 reps;Supine Heel Slides: Strengthening;Left;10 reps;Supine Hip ABduction/ADduction: AROM;Right;10 reps;Standing;Strengthening (2 sets with seated elevated leg position rest) Straight Leg Raises: AROM;Right;10 reps;Standing (2 sets with seated elevated leg position rest in between) Other Exercises Other Exercises: pt educated on true elevation   Shoulder Instructions       General Comments      Pertinent Vitals/ Pain       Pain Assessment: 0-10 Pain Score: 6  (6/10 at rest, 10/10 when RLE is down.) Pain Location: R knee/LE Pain Descriptors / Indicators: Throbbing Pain Intervention(s): Limited activity within patient's tolerance;Monitored during session;Premedicated before session;Other (comment) (elevation post treatment)  Home Living Family/patient expects to be discharged to:: Private residence Living Arrangements: Alone Available Help at Discharge: Neighbor;Available PRN/intermittently Type of Home:  House Home Access: Level entry     Home Layout: Two level;Able to live on main level with bedroom/bathroom     Bathroom Shower/Tub: Tub/shower unit Shower/tub characteristics: Engineer, building services: Standard Bathroom Accessibility: Yes How Accessible: Accessible via walker Home Equipment: Walker - 2 wheels;Walker - standard;Wheelchair - manual          Prior Functioning/Environment Level of Independence: Needs assistance            Frequency Min 1X/week     Progress Toward  Goals  OT Goals(current goals can now be found in the care plan section)  Progress towards OT goals: Progressing toward goals  Acute Rehab OT Goals OT Goal Formulation: With patient  Plan      Co-evaluation                 End of Session     Activity Tolerance Patient tolerated treatment well   Patient Left in bed;with call bell/phone within reach;with bed alarm set   Nurse Communication          Time: 4010-2725 OT Time Calculation (min): 17 min  Charges: OT General Charges $OT Visit: 1 Procedure OT Treatments $Self Care/Home Management : 8-22 mins   Olegario Messier, MS, OTR/L  Olegario Messier 08/16/2015, 12:41 PM

## 2015-08-16 NOTE — Progress Notes (Signed)
Physical Therapy Treatment Patient Details Name: Nathaniel Rivers MRN: [id number] DOB: 17-Dec-1975 Today's Date: 08/16/2015    History of Present Illness Patient is a 40 y.o. male admitted on 12 August 2015 with edema and swelling in the right knee. Pt. wears an AFO at home, however has rubbed a sore area on this right big toe. Pt. has a RW/manual wheelchair at home.    PT Comments    Pt agreeable to PT; notes continued throbbing pain (especially with Right lower extremity in dependent position). Pt educated on true elevation. Pt demonstrates improved bed mobility and transfers today without physical assist. Pt does physically self assist Right lower extremity in/out of bed. Pt agreeable to out of bed for short periods and participates in active range of motion/strengthening of right hip musculature and short ambulation. Pt requires seated rest with Right lower extremity on bed to manage throbbing/pain sensations between sets/ambulation. Pt placed in Right lower extremity elevated position post session. Continue PT to progress strength and functional mobility to allow for an optimal, safe return home.   Follow Up Recommendations  Outpatient PT     Equipment Recommendations  None recommended by PT    Recommendations for Other Services       Precautions / Restrictions Precautions Precautions: Fall Required Braces or Orthoses: Knee Immobilizer - Right Knee Immobilizer - Right: On at all times Restrictions Weight Bearing Restrictions: No    Mobility  Bed Mobility Overal bed mobility: Modified Independent Bed Mobility: Supine to Sit;Sit to Supine   Sidelying to sit: Modified independent (Device/Increase time) Supine to sit: Modified independent (Device/Increase time) Sit to supine: Modified independent (Device/Increase time)   General bed mobility comments: Pt self assists RLE in/out of bed grabbing KI by straps and moving   Transfers Overall transfer level: Modified  independent Equipment used: Rolling walker (2 wheeled) Transfers: Sit to/from Stand Sit to Stand: Modified independent (Device/Increase time)         General transfer comment: Pt takes several attempts with each stand but can perform sit to/from stand from bed without elevation using 1 hand on bed, 1 on rw technique with increased time.  Ambulation/Gait Ambulation/Gait assistance: Min guard Ambulation Distance (Feet): 10 Feet Assistive device: Rolling walker (2 wheeled) Gait Pattern/deviations:  (Hopping on LLE) Gait velocity: reduced Gait velocity interpretation: Below normal speed for age/gender General Gait Details: ambulation difficulty due to painful RLE. Pt notes being able to hop to the bathroom when needed, but pain/throbbing increases   Stairs            Wheelchair Mobility    Modified Rankin (Stroke Patients Only)       Balance Overall balance assessment: Needs assistance         Standing balance support: Bilateral upper extremity supported Standing balance-Leahy Scale: Fair Standing balance comment: Throbbing in RLE                    Cognition Arousal/Alertness: Awake/alert Behavior During Therapy: WFL for tasks assessed/performed Overall Cognitive Status: Within Functional Limits for tasks assessed                      Exercises General Exercises - Lower Extremity Ankle Circles/Pumps: Left;20 reps;Supine;AROM (RLE PROM with strap) Quad Sets: Strengthening;Both;15 reps;Supine Gluteal Sets: Strengthening;Both;15 reps;Supine Heel Slides: Strengthening;Left;10 reps;Supine Hip ABduction/ADduction: AROM;Right;10 reps;Standing;Strengthening (2 sets with seated elevated leg position rest) Straight Leg Raises: AROM;Right;10 reps;Standing (2 sets with seated elevated leg position rest in between) Other Exercises Other  Exercises: pt educated on true elevation    General Comments General comments (skin integrity, edema, etc.): Swelling  RLE      Pertinent Vitals/Pain Pain Assessment: 0-10 Pain Score: 6  Pain Location: R knee/LE Pain Descriptors / Indicators: Throbbing Pain Intervention(s): Limited activity within patient's tolerance;Monitored during session;Premedicated before session;Other (comment) (elevation post treatment)    Home Living                      Prior Function            PT Goals (current goals can now be found in the care plan section) Progress towards PT goals: Progressing toward goals    Frequency  7X/week    PT Plan Current plan remains appropriate    Co-evaluation             End of Session Equipment Utilized During Treatment: Gait belt;Right knee immobilizer Activity Tolerance: Patient limited by pain;Patient tolerated treatment well Patient left: in bed;with call bell/phone within reach;Other (comment) (refused alarm; RLE elevated)     Time: 1610-9604 PT Time Calculation (min) (ACUTE ONLY): 25 min  Charges:  $Gait Training: 8-22 mins $Therapeutic Exercise: 8-22 mins                    G Codes:      Kristeen Miss, PTA 08/16/2015, 10:37 AM

## 2015-08-17 LAB — AEROBIC/ANAEROBIC CULTURE W GRAM STAIN (SURGICAL/DEEP WOUND): Gram Stain: NONE SEEN

## 2015-08-17 LAB — AEROBIC/ANAEROBIC CULTURE (SURGICAL/DEEP WOUND)

## 2015-08-17 MED ORDER — CEFAZOLIN SODIUM-DEXTROSE 2-4 GM/100ML-% IV SOLN: 2.0000 g | Freq: Three times a day (TID) | INTRAVENOUS | 1 refills | Status: DC

## 2015-08-17 MED ORDER — OXYCODONE HCL 10 MG PO TABS: 5.0000 mg | ORAL_TABLET | ORAL | 0 refills | Status: DC | PRN

## 2015-08-17 MED ORDER — ASPIRIN EC 325 MG PO TBEC: 325.0000 mg | DELAYED_RELEASE_TABLET | Freq: Every day | ORAL | 0 refills | Status: AC

## 2015-08-17 MED ORDER — CEFAZOLIN SODIUM-DEXTROSE 2-4 GM/100ML-% IV SOLN
2.0000 g | Freq: Three times a day (TID) | INTRAVENOUS | Status: DC
  Administered 2015-08-17: 2 g via INTRAVENOUS
  Filled 2015-08-17 (×4): qty 100

## 2015-08-17 NOTE — Progress Notes (Signed)
Patient reports current pain regimen not effective, still having 7/10 pain after 15mg  Oxycodone 1 hour ago. Paged MD. Will be rounding ASAP.

## 2015-08-17 NOTE — Care Management (Signed)
Patient will need Ancef 2 gm every 8 hours for 4 weeks per physician notes. Arranged through [facility] per charity care. Patient will need 1st dose prior to discharge. [facility] cannot accept patient after 4 pm today. Anticipate discharge today with antibiotics.

## 2015-08-17 NOTE — Progress Notes (Signed)
Physical Therapy Treatment Patient Details Name: Nathaniel Rivers MRN: [id number] DOB: May 28, 1975 Today's Date: 08/17/2015    History of Present Illness Patient is a 40 y.o. male admitted on 12 August 2015 with edema and swelling in the right knee. Pt. wears an AFO at home, however has rubbed a sore area on this right big toe. Pt. has a RW/manual wheelchair at home.    PT Comments    Pt notes MD was in to see pt and informed pt to perform heel slides with Right lower extremity. Pt did so, and now is complaining of increased Right lower extremity pain (9/10). Pt educated on performing as active assisted range of motion using hands/strap to assist; educated as well on repetitions and frequency. Pt notes pain is limiting his desire to perform any exercises/movement at this time, but agreeable to some Left lower extremity exercises. Pt given blue theraband for resisted exercises on the left as well as for use for upper extremity strengthening. Pt notes knee immobilizer is to be worn at all times except for range of motion of right knee. Per pt and chart review, pt is expected to discharge home later today, but no current discharge order present. Continue PT for progression of strength and functional mobility if pt remains admitted.   Follow Up Recommendations  Outpatient PT     Equipment Recommendations  None recommended by PT    Recommendations for Other Services       Precautions / Restrictions Restrictions Weight Bearing Restrictions: No    Mobility  Bed Mobility               General bed mobility comments: Not tested; pt refuses out of bed due to pain level  Transfers                    Ambulation/Gait                 Stairs            Wheelchair Mobility    Modified Rankin (Stroke Patients Only)       Balance                                    Cognition Arousal/Alertness: Awake/alert Behavior During Therapy: WFL for tasks  assessed/performed Overall Cognitive Status: Within Functional Limits for tasks assessed                      Exercises General Exercises - Lower Extremity Ankle Circles/Pumps: AROM;Left;20 reps;Supine Quad Sets: Other (comment) (unable to tolerate RLE currently due to pain) Gluteal Sets: Strengthening;Both;20 reps;Supine Short Arc Quad: AROM;Left;20 reps;Supine Heel Slides: Strengthening;Left;15 reps;Supine (Blue band for resisted extension) Hip ABduction/ADduction: AROM;Strengthening;Left;15 reps Straight Leg Raises: Strengthening;Left;10 reps;Supine Other Exercises Other Exercises: Educated pt on performing RLE heelslides with AAROM self assisted with hands/strap around thigh. Also educated on repetitions/frequency    General Comments        Pertinent Vitals/Pain Pain Assessment: 0-10 Pain Score: 9  Pain Location: R knee/LE (pain was less; increased post active heel slides R per MD) Pain Descriptors / Indicators: Constant Pain Intervention(s): Limited activity within patient's tolerance;Premedicated before session;Patient requesting pain meds-RN notified;Other (comment) (refused elevation)    Home Living                      Prior Function  PT Goals (current goals can now be found in the care plan section) Progress towards PT goals: Progressing toward goals (slowly due to pain )    Frequency  7X/week    PT Plan Current plan remains appropriate    Co-evaluation             End of Session Equipment Utilized During Treatment: Other (comment) (T band) Activity Tolerance: Patient limited by pain Patient left: in bed;with call bell/phone within reach;Other (comment) (refused alarm)     Time: 1610-9604 PT Time Calculation (min) (ACUTE ONLY): 16 min  Charges:  $Therapeutic Exercise: 8-22 mins                    G Codes:      Kristeen Miss, PTA 08/17/2015, 11:40 AM
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
## 2015-08-17 NOTE — Care Management (Addendum)
Follow up appointment set up with Dr Sallee Lange at [facility] for Monday at 10am.  Added to discharge instructions and gave patient appointment slip. Patient has walker and WC available at home He will have a ride this afternoon if discharged. Referral placed with [facility] in [place] for Outpatient PT.

## 2015-08-17 NOTE — Progress Notes (Signed)
Patient discharging to home this evening, brother will provide transportation. Pt already has f/u appt scheduled with PCP on Monday, and needs to schedule F/U with Dr.Krasinski preferably Monday 7/31 or Tuesday 8/1. [facility] to f/u for [facility] starting tomorrow at 8am for IV ABT.

## 2015-08-17 NOTE — Discharge Instructions (Signed)
Patient needs to continue to elevate the right knee at home. He may apply ice to help reduce swelling. Patient should work on gentle knee range of motion. He may weight-bear as tolerated on the right lower extremity with his walker. Patient needs to follow-up with Dr. Martha Clan in clinic next Monday 08/21/15. He will receive Ancef 2 g every 8 hours IV administered by home health nurse. Staples will be removed in the office.
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
## 2015-08-18 LAB — BODY FLUID CELL COUNT WITH DIFFERENTIAL

## 2018-02-27 ENCOUNTER — Ambulatory Visit (INDEPENDENT_AMBULATORY_CARE_PROVIDER_SITE_OTHER): Payer: Medicare Other

## 2018-02-27 ENCOUNTER — Other Ambulatory Visit: Payer: Self-pay | Admitting: Podiatry

## 2018-02-27 ENCOUNTER — Ambulatory Visit (INDEPENDENT_AMBULATORY_CARE_PROVIDER_SITE_OTHER): Payer: Medicare Other | Admitting: Podiatry

## 2018-02-27 ENCOUNTER — Encounter: Payer: Self-pay | Admitting: Podiatry

## 2018-02-27 ENCOUNTER — Ambulatory Visit: Payer: Medicare Other

## 2018-02-27 VITALS — BP 105/60 | HR 61 | Temp 97.2°F

## 2018-02-27 DIAGNOSIS — L97519 Non-pressure chronic ulcer of other part of right foot with unspecified severity: Secondary | ICD-10-CM

## 2018-02-27 DIAGNOSIS — S99922A Unspecified injury of left foot, initial encounter: Secondary | ICD-10-CM

## 2018-02-27 DIAGNOSIS — S99912A Unspecified injury of left ankle, initial encounter: Secondary | ICD-10-CM

## 2018-02-27 DIAGNOSIS — G5791 Unspecified mononeuropathy of right lower limb: Secondary | ICD-10-CM | POA: Diagnosis not present

## 2018-02-27 DIAGNOSIS — M779 Enthesopathy, unspecified: Secondary | ICD-10-CM

## 2018-02-27 DIAGNOSIS — L97512 Non-pressure chronic ulcer of other part of right foot with fat layer exposed: Secondary | ICD-10-CM | POA: Diagnosis not present

## 2018-02-27 DIAGNOSIS — M21371 Foot drop, right foot: Secondary | ICD-10-CM

## 2018-02-27 NOTE — Patient Instructions (Signed)
Pre-Operative Instructions  Congratulations, you have decided to take an important step towards improving your quality of life.  You can be assured that the doctors and staff at [facility] will be with you every step of the way.  Here are some important things you should know:  1. Plan to be at the surgery center/hospital at least 1 (one) hour prior to your scheduled time, unless otherwise directed by the surgical center/hospital staff.  You must have a responsible adult accompany you, remain during the surgery and drive you home.  Make sure you have directions to the surgical center/hospital to ensure you arrive on time. 2. If you are having surgery at [facility] or [facility], you will need a copy of your medical history and physical form from your family physician within one month prior to the date of surgery. We will give you a form for your primary physician to complete.  3. We make every effort to accommodate the date you request for surgery.  However, there are times where surgery dates or times have to be moved.  We will contact you as soon as possible if a change in schedule is required.   4. No aspirin/ibuprofen for one week before surgery.  If you are on aspirin, any non-steroidal anti-inflammatory medications (Mobic, Aleve, Ibuprofen) should not be taken seven (7) days prior to your surgery.  You make take Tylenol for pain prior to surgery.  5. Medications - If you are taking daily heart and blood pressure medications, seizure, reflux, allergy, asthma, anxiety, pain or diabetes medications, make sure you notify the surgery center/hospital before the day of surgery so they can tell you which medications you should take or avoid the day of surgery. 6. No food or drink after midnight the night before surgery unless directed otherwise by surgical center/hospital staff. 7. No alcoholic beverages 24-hours prior to surgery.  No smoking 24-hours prior or 24-hours after  surgery. 8. Wear loose pants or shorts. They should be loose enough to fit over bandages, boots, and casts. 9. Don't wear slip-on shoes. Sneakers are preferred. 10. Bring your boot with you to the surgery center/hospital.  Also bring crutches or a walker if your physician has prescribed it for you.  If you do not have this equipment, it will be provided for you after surgery. 11. If you have not been contacted by the surgery center/hospital by the day before your surgery, call to confirm the date and time of your surgery. 12. Leave-time from work may vary depending on the type of surgery you have.  Appropriate arrangements should be made prior to surgery with your employer. 13. Prescriptions will be provided immediately following surgery by your doctor.  Fill these as soon as possible after surgery and take the medication as directed. Pain medications will not be refilled on weekends and must be approved by the doctor. 14. Remove nail polish on the operative foot and avoid getting pedicures prior to surgery. 15. Wash the night before surgery.  The night before surgery wash the foot and leg well with water and the antibacterial soap provided. Be sure to pay special attention to beneath the toenails and in between the toes.  Wash for at least three (3) minutes. Rinse thoroughly with water and dry well with a towel.  Perform this wash unless told not to do so by your physician.  Enclosed: 1 Ice pack (please put in freezer the night before surgery)   1 Hibiclens skin cleaner     Pre-op instructions  If you have any questions regarding the instructions, please do not hesitate to call our office.  [place]: [address], [place], [place] [address] -- [phone]  [place]: [address] [place], [place] [address] -- [phone]  [place]: [address]  [place], [place] [address] -- [phone]  [place]: [address], [address], [place], [place] [address] -- [phone]  Website:  [email or web address] 
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
## 2018-03-02 ENCOUNTER — Telehealth: Payer: Self-pay | Admitting: *Deleted

## 2018-03-02 NOTE — Telephone Encounter (Signed)
"  I saw Dr. Logan Bores on Friday.  He told me to call you today to schedule my surgery."  Dr. Michel Harrow next available date is February 27.  "Okay, that date will be good."  I'll get you scheduled.  Someone from the surgical center will give you a call a day or two prior to your surgical date and they will give you your arrival time.

## 2018-03-19 ENCOUNTER — Other Ambulatory Visit: Payer: Self-pay | Admitting: Podiatry

## 2018-03-19 DIAGNOSIS — M2031 Hallux varus (acquired), right foot: Secondary | ICD-10-CM | POA: Diagnosis not present

## 2018-03-19 DIAGNOSIS — Z4889 Encounter for other specified surgical aftercare: Secondary | ICD-10-CM | POA: Diagnosis not present

## 2018-03-19 MED ORDER — CEPHALEXIN 500 MG PO CAPS: 500.0000 mg | ORAL_CAPSULE | Freq: Three times a day (TID) | ORAL | 0 refills | Status: AC

## 2018-03-19 MED ORDER — OXYCODONE-ACETAMINOPHEN 5-325 MG PO TABS: 1.0000 | ORAL_TABLET | Freq: Four times a day (QID) | ORAL | 0 refills | Status: AC | PRN

## 2018-03-19 NOTE — Progress Notes (Signed)
Post op

## 2018-03-27 ENCOUNTER — Ambulatory Visit (INDEPENDENT_AMBULATORY_CARE_PROVIDER_SITE_OTHER): Payer: Medicare Other

## 2018-03-27 ENCOUNTER — Ambulatory Visit (INDEPENDENT_AMBULATORY_CARE_PROVIDER_SITE_OTHER): Payer: Medicare Other | Admitting: Podiatry

## 2018-03-27 ENCOUNTER — Ambulatory Visit: Payer: Self-pay

## 2018-03-27 VITALS — BP 108/65 | HR 60 | Temp 98.5°F

## 2018-03-27 DIAGNOSIS — L97512 Non-pressure chronic ulcer of other part of right foot with fat layer exposed: Secondary | ICD-10-CM | POA: Diagnosis not present

## 2018-03-27 DIAGNOSIS — M21371 Foot drop, right foot: Secondary | ICD-10-CM

## 2018-03-27 DIAGNOSIS — Z9889 Other specified postprocedural states: Secondary | ICD-10-CM

## 2018-03-27 MED ORDER — TRAMADOL HCL 50 MG PO TABS: 50.0000 mg | ORAL_TABLET | Freq: Three times a day (TID) | ORAL | 0 refills | Status: AC | PRN

## 2018-03-30 ENCOUNTER — Encounter: Payer: Self-pay | Admitting: Podiatry

## 2018-03-30 NOTE — Progress Notes (Signed)
   Subjective:  Patient presents today status post hallux IPJ fusion right and removal of staples/foreign bodies right. DOS: 03/19/2018. He states he is improving. He has been taking the pain medication which makes the pain tolerable. He reports some associated nausea secondary to the pain medication. There are no worsening factors noted. Patient is here for further evaluation and treatment.    No past medical history on file.    Objective/Physical Exam Neurovascular status intact.  Skin incisions appear to be well coapted with sutures and staples intact. No sign of infectious process noted. No dehiscence. No active bleeding noted. Moderate edema noted to the surgical extremity.  Radiographic Exam:  Orthopedic hardware and osteotomies sites appear to be stable with routine healing.  Assessment: 1. s/p hallux IPJ fusion right and removal of staples/foreign body right. DOS: 03/19/2018   Plan of Care:  1. Patient was evaluated. X-rays reviewed 2. Dressing changed.  3. Continue weightbearing in CAM boot.  4. Prescription for Tramadol #30 provided to patient.  5. Return to clinic in one week.    Felecia Shelling, DPM [facility]  Dr. Felecia Shelling, DPM    [address]                                        [place], [place] [address]                Office [phone]  Fax [phone]

## 2018-04-03 ENCOUNTER — Ambulatory Visit (INDEPENDENT_AMBULATORY_CARE_PROVIDER_SITE_OTHER): Payer: Medicare Other | Admitting: Podiatry

## 2018-04-03 ENCOUNTER — Other Ambulatory Visit: Payer: Self-pay

## 2018-04-03 ENCOUNTER — Encounter: Payer: Self-pay | Admitting: Podiatry

## 2018-04-03 DIAGNOSIS — Z9889 Other specified postprocedural states: Secondary | ICD-10-CM

## 2018-04-03 DIAGNOSIS — L97512 Non-pressure chronic ulcer of other part of right foot with fat layer exposed: Secondary | ICD-10-CM

## 2018-04-06 NOTE — Progress Notes (Signed)
   Subjective:  Patient presents today status post hallux IPJ fusion right and removal of staples/foreign bodies right. DOS: 03/19/2018. He states he is doing well. He denies any significant pain or modifying factors. He has been using the CAM boot as directed. Patient is here for further evaluation and treatment.    History reviewed. No pertinent past medical history.    Objective/Physical Exam Neurovascular status intact.  Skin incisions appear to be well coapted with sutures and staples intact. No sign of infectious process noted. No dehiscence. No active bleeding noted. Moderate edema noted to the surgical extremity.  Assessment: 1. s/p hallux IPJ fusion right and removal of staples/foreign body right. DOS: 03/19/2018   Plan of Care:  1. Patient was evaluated. 2. Sutures removed.  3. Continue weightbearing in CAM boot.  4. Return to clinic in two weeks for follow up X-Ray.    Felecia Shelling, DPM [facility]  Dr. Felecia Shelling, DPM    [address]                                        [place], [place] [address]                Office [phone]  Fax [phone]

## 2018-04-14 ENCOUNTER — Ambulatory Visit (INDEPENDENT_AMBULATORY_CARE_PROVIDER_SITE_OTHER): Payer: Medicare Other | Admitting: Podiatry

## 2018-04-14 ENCOUNTER — Other Ambulatory Visit: Payer: Self-pay

## 2018-04-14 ENCOUNTER — Encounter: Payer: Self-pay | Admitting: Podiatry

## 2018-04-14 ENCOUNTER — Ambulatory Visit (INDEPENDENT_AMBULATORY_CARE_PROVIDER_SITE_OTHER): Payer: Medicare Other

## 2018-04-14 DIAGNOSIS — L97512 Non-pressure chronic ulcer of other part of right foot with fat layer exposed: Secondary | ICD-10-CM

## 2018-04-14 DIAGNOSIS — Z9889 Other specified postprocedural states: Secondary | ICD-10-CM

## 2018-04-15 NOTE — Progress Notes (Signed)
   Subjective:  Patient presents today status post hallux IPJ fusion right and removal of staples/foreign bodies right. DOS: 03/19/2018. He states he is doing well. He denies any pain or modifying factors. He reports some associated swelling. He has been using the CAM boot as directed. Patient is here for further evaluation and treatment.    History reviewed. No pertinent past medical history.    Objective/Physical Exam Neurovascular status intact.  Skin incisions appear to be well coapted. No sign of infectious process noted. No dehiscence. No active bleeding noted. Moderate edema noted to the surgical extremity.  Radiographic Exam:  Orthopedic hardware and osteotomies sites appear to be stable with routine healing.  Assessment: 1. s/p hallux IPJ fusion right and removal of staples/foreign body right. DOS: 03/19/2018   Plan of Care:  1. Patient was evaluated. X-Rays reviewed.  2. Discontinue using CAM boot.  3. Resume wearing good shoes and AFO for the RLE. 4. May resume full activity with no restrictions.  5. Return to clinic as needed.    Felecia Shelling, DPM [facility]  Dr. Felecia Shelling, DPM    [address]                                        [place], [place] [address]                Office [phone]  Fax [phone]

## 2018-04-17 ENCOUNTER — Encounter: Payer: Medicare Other | Admitting: Podiatry

## 2018-05-01 ENCOUNTER — Encounter: Payer: Medicare Other | Admitting: Podiatry

## 2019-07-15 ENCOUNTER — Other Ambulatory Visit: Payer: Self-pay

## 2019-07-15 ENCOUNTER — Encounter: Payer: Self-pay | Admitting: *Deleted

## 2019-07-15 ENCOUNTER — Emergency Department: Payer: Medicare Other

## 2019-07-15 DIAGNOSIS — Y9289 Other specified places as the place of occurrence of the external cause: Secondary | ICD-10-CM | POA: Diagnosis not present

## 2019-07-15 DIAGNOSIS — Y998 Other external cause status: Secondary | ICD-10-CM | POA: Diagnosis not present

## 2019-07-15 DIAGNOSIS — Z87891 Personal history of nicotine dependence: Secondary | ICD-10-CM | POA: Diagnosis not present

## 2019-07-15 DIAGNOSIS — S91311A Laceration without foreign body, right foot, initial encounter: Secondary | ICD-10-CM | POA: Insufficient documentation

## 2019-07-15 DIAGNOSIS — Y9389 Activity, other specified: Secondary | ICD-10-CM | POA: Insufficient documentation

## 2019-07-15 DIAGNOSIS — X58XXXA Exposure to other specified factors, initial encounter: Secondary | ICD-10-CM | POA: Diagnosis not present

## 2019-07-15 DIAGNOSIS — Z79899 Other long term (current) drug therapy: Secondary | ICD-10-CM | POA: Diagnosis not present

## 2019-07-15 NOTE — ED Triage Notes (Signed)
Pt has a laceration to bottom of right foot  Pt stepped on a piece of glass.   Bleeding controlled.

## 2019-07-16 ENCOUNTER — Emergency Department
Admission: EM | Admit: 2019-07-16 | Discharge: 2019-07-16 | Disposition: A | Discharge status: Home / Self Care | Payer: Medicare Other | Attending: Emergency Medicine | Admitting: Emergency Medicine

## 2019-07-16 DIAGNOSIS — S91311A Laceration without foreign body, right foot, initial encounter: Secondary | ICD-10-CM

## 2019-07-16 MED ORDER — IBUPROFEN 800 MG PO TABS
800.0000 mg | ORAL_TABLET | Freq: Once | ORAL | Status: DC
  Filled 2019-07-16: qty 1

## 2019-07-16 MED ORDER — LIDOCAINE HCL (PF) 1 % IJ SOLN
5.0000 mL | Freq: Once | INTRAMUSCULAR | Status: DC
  Filled 2019-07-16: qty 5

## 2019-07-16 MED ORDER — CEPHALEXIN 500 MG PO CAPS
500.0000 mg | ORAL_CAPSULE | Freq: Once | ORAL | Status: AC
  Administered 2019-07-16: 500 mg via ORAL
  Filled 2019-07-16: qty 1

## 2019-07-16 MED ORDER — CEPHALEXIN 500 MG PO CAPS
500.0000 mg | ORAL_CAPSULE | Freq: Two times a day (BID) | ORAL | 0 refills | Status: AC
Start: 2019-07-16 — End: 2019-07-23

## 2019-07-16 MED ORDER — LIDOCAINE HCL (PF) 1 % IJ SOLN
10.0000 mL | Freq: Once | INTRAMUSCULAR | Status: AC
  Administered 2019-07-16: 10 mL via INTRADERMAL
  Filled 2019-07-16: qty 10

## 2019-07-16 NOTE — ED Provider Notes (Signed)
[facility] Emergency Department Provider Note  ____________________________________________   First MD Initiated Contact with Patient 07/16/19 0107     (approximate)  I have reviewed the triage vital signs and the nursing notes.   HISTORY  Chief Complaint Laceration    HPI Nathaniel Rivers is a 44 y.o. male presents emergency department secondary to a laceration on the bottom of his right foot which was sustained after stepping on a piece of glass.  Bleeding controlled at this time.  Patient states last tetanus shot was 2 years ago.  Patient currently missed 8 out of 10 pain.       No past medical history on file.  Patient Active Problem List   Diagnosis Date Noted  . Septic joint of right knee joint ([place]) 08/12/2015    Past Surgical History:  Procedure Laterality Date  . INCISION AND DRAINAGE Right 08/12/2015   Procedure: INCISION AND DRAINAGE;  Surgeon: Thornton Park, MD;  Location: [facility] ORS;  Service: Orthopedics;  Laterality: Right;  . KNEE SURGERY Bilateral 2015  . LEG SURGERY Bilateral 2015    Prior to Admission medications   Medication Sig Start Date End Date Taking? Authorizing Provider  aspirin EC 325 MG tablet Take 1 tablet (325 mg total) by mouth daily. 08/17/15   Thornton Park, MD  cephALEXin (KEFLEX) 500 MG capsule Take 1 capsule (500 mg total) by mouth 3 (three) times daily. 03/19/18   Edrick Kins, DPM  citalopram (CELEXA) 20 MG tablet Take 20 mg by mouth daily. 11/22/17   [provider]  gabapentin (NEURONTIN) 300 MG capsule Take 300 mg by mouth 3 (three) times daily.    [provider]  lidocaine (LIDODERM) 5 % Place 1 patch onto the skin daily. Remove & Discard patch within 12 hours or as directed by MD    [provider]  oxyCODONE-acetaminophen (PERCOCET) 5-325 MG tablet Take 1 tablet by mouth every 6 (six) hours as needed for severe pain. 03/19/18   Edrick Kins, DPM  traMADol (ULTRAM) 50 MG  tablet Take 1 tablet (50 mg total) by mouth every 8 (eight) hours as needed. 03/27/18   Edrick Kins, DPM    Allergies Patient has no known allergies.  No family history on file.  Social History Social History   Tobacco Use  . Smoking status: Former Research scientist (life sciences)  . Smokeless tobacco: Never Used  Substance Use Topics  . Alcohol use: Yes    Comment: weekends  . Drug use: No    Review of Systems Constitutional: No fever/chills Eyes: No visual changes. ENT: No sore throat. Cardiovascular: Denies chest pain. Respiratory: Denies shortness of breath. Gastrointestinal: No abdominal pain.  No nausea, no vomiting.  No diarrhea.  No constipation. Genitourinary: Negative for dysuria. Musculoskeletal: Negative for neck pain.  Negative for back pain. Integumentary: Negative for rash.  Positive for right foot laceration Neurological: Negative for headaches, focal weakness or numbness.  ____________________________________________   PHYSICAL EXAM:  VITAL SIGNS: ED Triage Vitals  Enc Vitals Group     BP 07/15/19 2215 115/74     Pulse Rate 07/15/19 2213 (!) 50     Resp 07/15/19 2213 18     Temp 07/15/19 2213 98.5 F (36.9 C)     Temp Source 07/15/19 2213 Oral     SpO2 07/15/19 2213 100 %     Weight 07/15/19 2215 70.3 kg (155 lb)     Height 07/15/19 2215 1.778 m (5\' 10" )  Head Circumference --      Peak Flow --      Pain Score 07/15/19 2215 10     Pain Loc --      Pain Edu? --      Excl. in GC? --     Constitutional: Alert and oriented.  Eyes: Conjunctivae are normal.  Mouth/Throat: Patient is wearing a mask. Neck: No stridor. Musculoskeletal: No lower extremity tenderness nor edema. No gross deformities of extremities. Neurologic:  Normal speech and language. No gross focal neurologic deficits are appreciated.  Skin: Centimeter linear laceration plantar aspect of the right foot base of the first toe Psychiatric: Mood and affect are normal. Speech and behavior are  normal.    RADIOLOGY I, [place] Paletta, personally viewed and evaluated these images (plain radiographs) as part of my medical decision making, as well as reviewing the written report by the radiologist.  ED MD interpretation: No radiopaque foreign body noted no osseous abnormality on x-ray per radiologist  Official radiology report(s): DG Foot Complete Right  Result Date: 07/15/2019 CLINICAL DATA:  Foot laceration after stepping on glass EXAM: RIGHT FOOT COMPLETE - 3+ VIEW COMPARISON:  None. FINDINGS: There is an arthrodesis lag screw stent at the interphalangeal joint of the right great toe. No radiopaque foreign body. Bandage material at the anterior plantar surface. IMPRESSION: No radiopaque foreign body or acute osseous abnormality. Electronically Signed   By: Deatra Robinson M.D.   On: 07/15/2019 22:57     .Marland KitchenLaceration Repair  Date/Time: 07/16/2019 1:30 AM Performed by: Darci Current, MD Authorized by: Darci Current, MD   Consent:    Consent obtained:  Verbal   Consent given by:  Patient   Risks discussed:  Infection, pain, retained foreign body, poor cosmetic result and poor wound healing Anesthesia (see MAR for exact dosages):    Anesthesia method:  Local infiltration   Local anesthetic:  Lidocaine 1% w/o epi Laceration details:    Location:  Foot   Foot location:  Sole of R foot   Length (cm):  8 Repair type:    Repair type:  Simple Exploration:    Hemostasis achieved with:  Direct pressure   Wound exploration: entire depth of wound probed and visualized     Contaminated: no   Treatment:    Area cleansed with:  Saline   Amount of cleaning:  Extensive   Irrigation solution:  Sterile saline   Visualized foreign bodies/material removed: no   Skin repair:    Repair method:  Sutures   Suture size:  4-0   Suture technique:  Simple interrupted   Number of sutures:  8 Approximation:    Approximation:  Close Post-procedure details:    Dressing:  Sterile  dressing   Patient tolerance of procedure:  Tolerated well, no immediate complications     ____________________________________________   INITIAL IMPRESSION / MDM / ASSESSMENT AND PLAN / ED COURSE  As part of my medical decision making, I reviewed the following data within the electronic MEDICAL RECORD NUMBER  44 year old male presented with above-stated history and physical exam secondary right foot laceration which was repaired without difficulty.  Patient prescribed Keflex prophylactic.  ____________________________________________  FINAL CLINICAL IMPRESSION(S) / ED DIAGNOSES  Final diagnoses:  Foot laceration, right, initial encounter     MEDICATIONS GIVEN DURING THIS VISIT:  Medications  cephALEXin (KEFLEX) capsule 500 mg (500 mg Oral Given 07/16/19 0120)  lidocaine (PF) (XYLOCAINE) 1 % injection 10 mL (10 mLs Intradermal Given 07/16/19 0121)  ED Discharge Orders    None      *Please note:  Nathaniel Rivers was evaluated in Emergency Department on 07/16/2019 for the symptoms described in the history of present illness. He was evaluated in the context of the global COVID-19 pandemic, which necessitated consideration that the patient might be at risk for infection with the SARS-CoV-2 virus that causes COVID-19. Institutional protocols and algorithms that pertain to the evaluation of patients at risk for COVID-19 are in a state of rapid change based on information released by regulatory bodies including the CDC and federal and state organizations. These policies and algorithms were followed during the patient's care in the ED.  Some ED evaluations and interventions may be delayed as a result of limited staffing during and after the pandemic.*  Note:  This document was prepared using Dragon voice recognition software and may include unintentional dictation errors.   Darci Current, MD 07/16/19 [phone]

## 2019-07-16 NOTE — ED Notes (Signed)
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

## 2019-07-16 NOTE — ED Notes (Signed)
Dry sterile dressing applied to right foot. Pt given instructions on how to care for sutures and dressing changes.

## 2019-09-13 ENCOUNTER — Encounter: Payer: Self-pay | Admitting: Emergency Medicine

## 2019-09-13 ENCOUNTER — Other Ambulatory Visit: Payer: Self-pay

## 2019-09-13 DIAGNOSIS — Y9289 Other specified places as the place of occurrence of the external cause: Secondary | ICD-10-CM | POA: Insufficient documentation

## 2019-09-13 DIAGNOSIS — S0592XA Unspecified injury of left eye and orbit, initial encounter: Secondary | ICD-10-CM | POA: Diagnosis present

## 2019-09-13 DIAGNOSIS — Y9367 Activity, basketball: Secondary | ICD-10-CM | POA: Insufficient documentation

## 2019-09-13 DIAGNOSIS — Z7982 Long term (current) use of aspirin: Secondary | ICD-10-CM | POA: Diagnosis not present

## 2019-09-13 DIAGNOSIS — Z79899 Other long term (current) drug therapy: Secondary | ICD-10-CM | POA: Diagnosis not present

## 2019-09-13 DIAGNOSIS — Y999 Unspecified external cause status: Secondary | ICD-10-CM | POA: Insufficient documentation

## 2019-09-13 DIAGNOSIS — W500XXA Accidental hit or strike by another person, initial encounter: Secondary | ICD-10-CM | POA: Insufficient documentation

## 2019-09-13 DIAGNOSIS — S0502XA Injury of conjunctiva and corneal abrasion without foreign body, left eye, initial encounter: Secondary | ICD-10-CM | POA: Diagnosis not present

## 2019-09-13 DIAGNOSIS — Z87891 Personal history of nicotine dependence: Secondary | ICD-10-CM | POA: Diagnosis not present

## 2019-09-13 NOTE — ED Triage Notes (Signed)
Patient presents to the ED with left eye pain x 1.5 hours.  Patient states he was playing basketball and a "pebble" got into his eye.

## 2019-09-14 ENCOUNTER — Emergency Department
Admission: EM | Admit: 2019-09-14 | Discharge: 2019-09-14 | Disposition: A | Discharge status: Home / Self Care | Payer: Medicare Other | Attending: Emergency Medicine | Admitting: Emergency Medicine

## 2019-09-14 DIAGNOSIS — S0502XA Injury of conjunctiva and corneal abrasion without foreign body, left eye, initial encounter: Secondary | ICD-10-CM

## 2019-09-14 MED ORDER — ERYTHROMYCIN 5 MG/GM OP OINT: TOPICAL_OINTMENT | Freq: Once | OPHTHALMIC | Status: DC

## 2019-09-14 MED ORDER — ERYTHROMYCIN 5 MG/GM OP OINT: 1.0000 "application " | TOPICAL_OINTMENT | Freq: Every day | OPHTHALMIC | 0 refills | Status: AC

## 2019-09-14 NOTE — ED Provider Notes (Signed)
[facility] Emergency Department Provider Note  ____________________________________________   First MD Initiated Contact with Patient 09/14/19 [id number]     (approximate)  I have reviewed the triage vital signs and the nursing notes.   HISTORY  Chief Complaint Eye Problem    HPI Nathaniel Rivers is a 44 y.o. male Modena Jansky to the emergency department secondary to left eye pain and foreign body sensation.  Patient states while playing basketball with his friends son he threw the basketball and felt as though department got into his left eye.  Patient states that since that time despite rinsing his eye he continues to have discomfort in the left eye and sensation of foreign body there.        History reviewed. No pertinent past medical history.  Patient Active Problem List   Diagnosis Date Noted  . Septic joint of right knee joint ([facility]) 08/12/2015    Past Surgical History:  Procedure Laterality Date  . INCISION AND DRAINAGE Right 08/12/2015   Procedure: INCISION AND DRAINAGE;  Surgeon: Juanell Fairly, MD;  Location: [facility] ORS;  Service: Orthopedics;  Laterality: Right;  . KNEE SURGERY Bilateral 2015  . LEG SURGERY Bilateral 2015    Prior to Admission medications   Medication Sig Start Date End Date Taking? Authorizing Provider  aspirin EC 325 MG tablet Take 1 tablet (325 mg total) by mouth daily. 08/17/15   Juanell Fairly, MD  cephALEXin (KEFLEX) 500 MG capsule Take 1 capsule (500 mg total) by mouth 3 (three) times daily. 03/19/18   Felecia Shelling, DPM  citalopram (CELEXA) 20 MG tablet Take 20 mg by mouth daily. 11/22/17   [provider]  gabapentin (NEURONTIN) 300 MG capsule Take 300 mg by mouth 3 (three) times daily.    [provider]  lidocaine (LIDODERM) 5 % Place 1 patch onto the skin daily. Remove & Discard patch within 12 hours or as directed by MD    [provider]  oxyCODONE-acetaminophen (PERCOCET) 5-325 MG tablet Take 1  tablet by mouth every 6 (six) hours as needed for severe pain. 03/19/18   Felecia Shelling, DPM  traMADol (ULTRAM) 50 MG tablet Take 1 tablet (50 mg total) by mouth every 8 (eight) hours as needed. 03/27/18   Felecia Shelling, DPM    Allergies Patient has no known allergies.  No family history on file.  Social History Social History   Tobacco Use  . Smoking status: Former Games developer  . Smokeless tobacco: Never Used  Substance Use Topics  . Alcohol use: Yes    Comment: weekends  . Drug use: No    Review of Systems Constitutional: No fever/chills Eyes: No visual changes.  Positive for left eye discomfort and foreign body sensation ENT: No sore throat. Cardiovascular: Denies chest pain. Respiratory: Denies shortness of breath. Gastrointestinal: No abdominal pain.  No nausea, no vomiting.  No diarrhea.  No constipation. Genitourinary: Negative for dysuria. Musculoskeletal: Negative for neck pain.  Negative for back pain. Integumentary: Negative for rash. Neurological: Negative for headaches, focal weakness or numbness.   ____________________________________________   PHYSICAL EXAM:  VITAL SIGNS: ED Triage Vitals  Enc Vitals Group     BP 09/13/19 2204 (!) 142/77     Pulse Rate 09/13/19 2204 73     Resp 09/13/19 2204 20     Temp 09/13/19 2204 98.3 F (36.8 C)     Temp Source 09/13/19 2204 Oral     SpO2 09/13/19 2204 98 %  Weight 09/13/19 2205 72.6 kg (160 lb)     Height 09/13/19 2205 1.753 m (5\' 9" )     Head Circumference --      Peak Flow --      Pain Score 09/13/19 2205 9     Pain Loc --      Pain Edu? --      Excl. in GC? --     Constitutional: Alert and oriented.  Eyes: Corneal abrasion noted at the o'clock position of the left eye. Head: Atraumatic. Mouth/Throat: Patient is wearing a mask. Neck: No stridor.   Musculoskeletal: No lower extremity tenderness nor edema. No gross deformities of extremities. Neurologic:  Normal speech and language. No gross focal  neurologic deficits are appreciated.  Skin:  Skin is warm, dry and intact. Psychiatric: Mood and affect are normal. Speech and behavior are normal. ____________________________________________    Procedures   ____________________________________________   INITIAL IMPRESSION / MDM / ASSESSMENT AND PLAN / ED COURSE  As part of my medical decision making, I reviewed the following data within the electronic MEDICAL RECORD NUMBER  44 year old male presented with above-stated history and physical exam system with a corneal abrasion of the left eye.  Tetracaine was introduced in the eye fluorescein staining performed which revealed a corneal abrasion at 5 o'clock position.  Patient be prescribed erythromycin ophthalmic for home.      ____________________________________________  FINAL CLINICAL IMPRESSION(S) / ED DIAGNOSES  Final diagnoses:  Abrasion of left cornea, initial encounter     MEDICATIONS GIVEN DURING THIS VISIT:  Medications - No data to display   ED Discharge Orders    None      *Please note:  Nathaniel Rivers was evaluated in Emergency Department on 09/14/2019 for the symptoms described in the history of present illness. He was evaluated in the context of the global COVID-19 pandemic, which necessitated consideration that the patient might be at risk for infection with the SARS-CoV-2 virus that causes COVID-19. Institutional protocols and algorithms that pertain to the evaluation of patients at risk for COVID-19 are in a state of rapid change based on information released by regulatory bodies including the CDC and federal and state organizations. These policies and algorithms were followed during the patient's care in the ED.  Some ED evaluations and interventions may be delayed as a result of limited staffing during and after the pandemic.*  Note:  This document was prepared using Dragon voice recognition software and may include unintentional dictation errors.   09/16/2019, MD 09/14/19 [phone]

## 2021-03-27 IMAGING — CR DG FOOT COMPLETE 3+V*R*
1 series · 3 of 3 positions shown · non-contrast
Comparison: None.

CLINICAL DATA: Foot laceration after stepping on glass

EXAM:
RIGHT FOOT COMPLETE - 3+ VIEW

[Series 1: x foot ap right · 0.14mm/px · 3 of 3 slices shown]
[im 1/3]
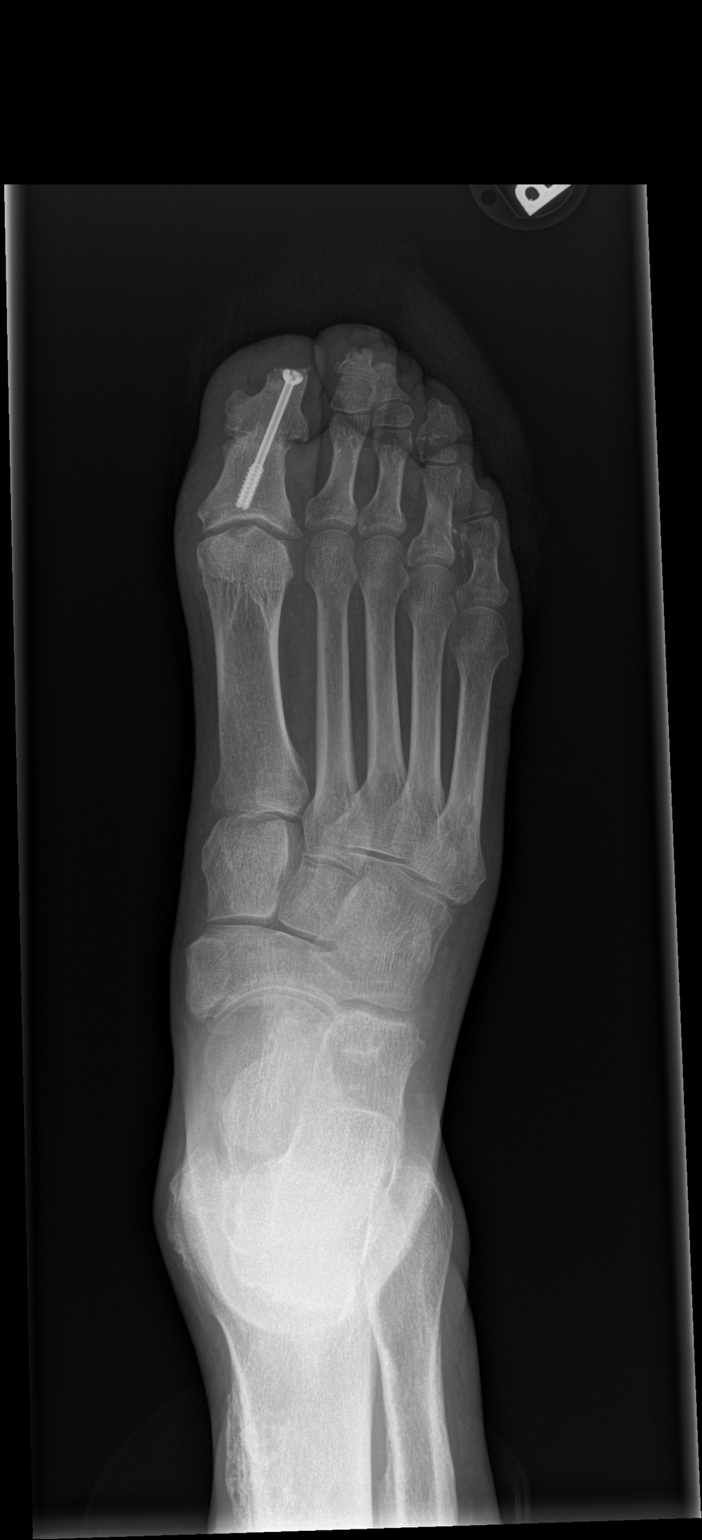
[im 2/3]
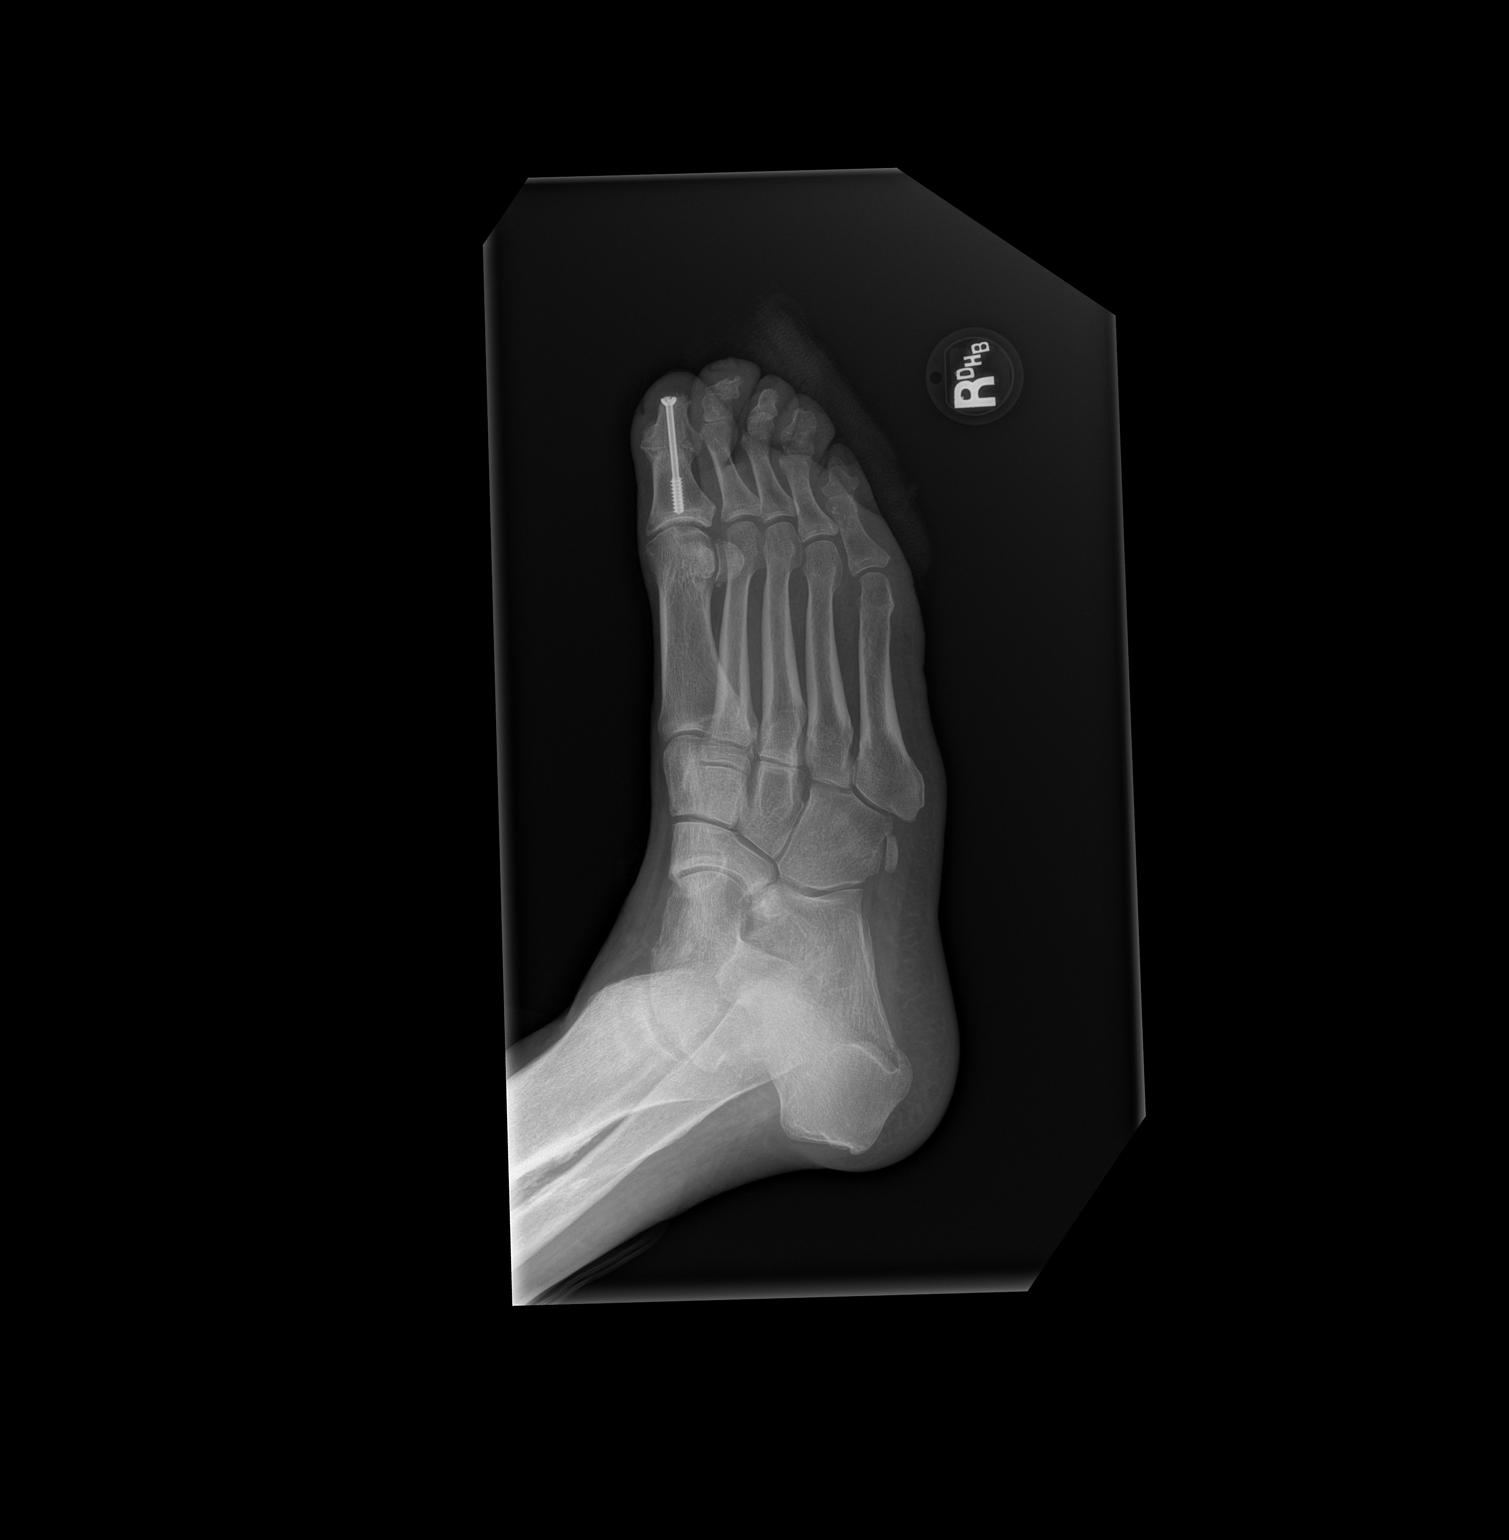
[im 3/3]
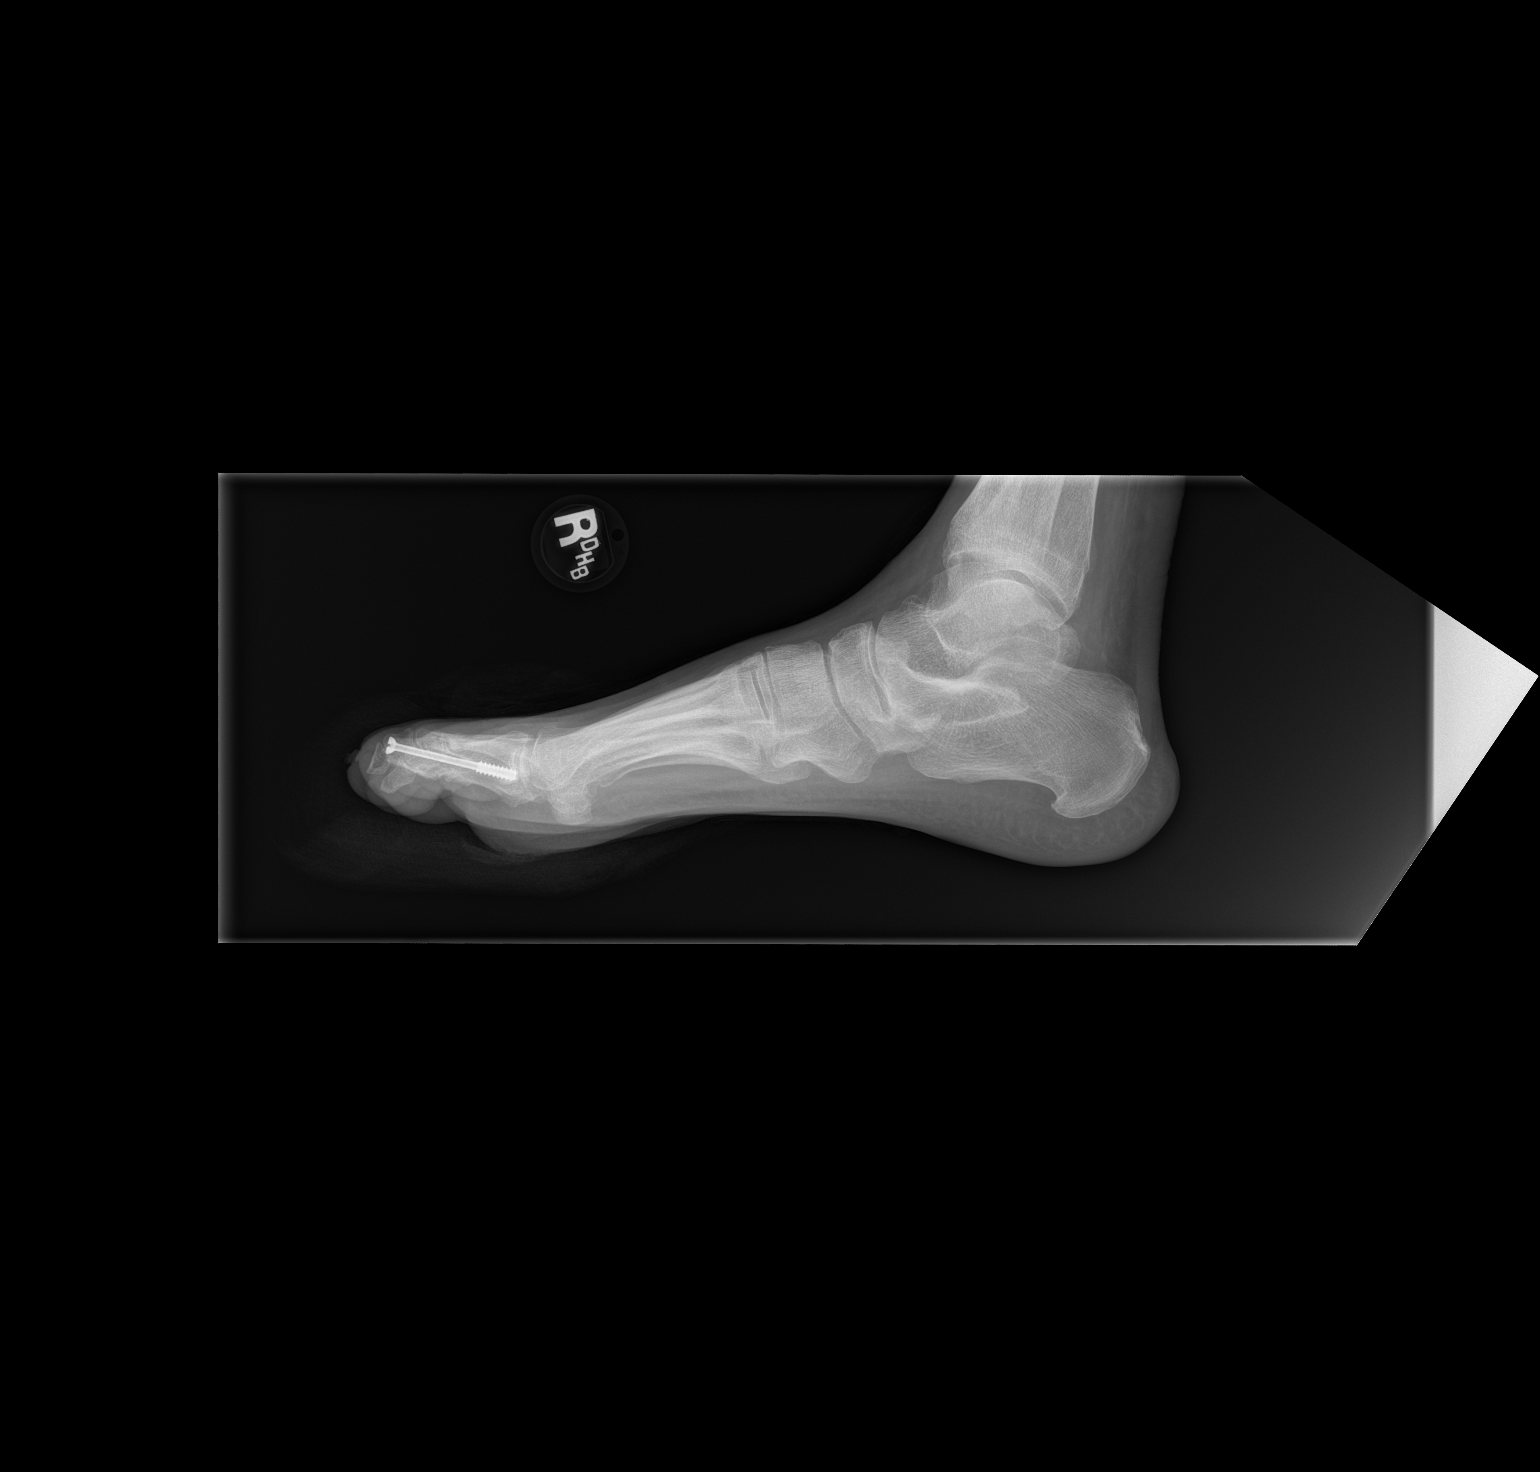

[3 of 3 positions shown; findings below may reference images not displayed]

FINDINGS: There is an arthrodesis lag screw stent at the interphalangeal joint
of the right great toe. No radiopaque foreign body. Bandage material
at the anterior plantar surface.
IMPRESSION: No radiopaque foreign body or acute osseous abnormality.

## 2022-01-22 ENCOUNTER — Ambulatory Visit: Payer: Medicare Other | Admitting: Physician Assistant

## 2022-02-25 ENCOUNTER — Encounter: Payer: Medicare Other | Attending: Physician Assistant | Admitting: Physician Assistant

## 2022-02-25 DIAGNOSIS — I890 Lymphedema, not elsewhere classified: Secondary | ICD-10-CM | POA: Insufficient documentation

## 2022-02-25 DIAGNOSIS — L988 Other specified disorders of the skin and subcutaneous tissue: Secondary | ICD-10-CM | POA: Diagnosis not present

## 2022-02-25 NOTE — Progress Notes (Signed)
Nathaniel Rivers ([id number]) [id number] Page 1 of 5 Visit Report for 02/25/2022 Abuse Risk Screen Details Patient Name: Date of Service: Nathaniel Rivers, Nathaniel Rivers 02/25/2022 10:30 A M Medical Record Number: [id number] Patient Account Number: [id number] Date of Birth/Sex: Treating RN: 07/22/1975 (47 y.o. Seward Meth Primary Care Jerelle Virden: Sharyn Creamer Other Clinician: Referring Luisdavid Hamblin: Treating Nessa Ramaker/Extender: Tretha Sciara in Treatment: 0 Abuse Risk Screen Items Answer Electronic Signature(s) Signed: 02/25/2022 10:54:06 AM By: Rosalio Loud MSN RN CNS WTA Entered By: Rosalio Loud on 02/25/2022 10:54:06 -------------------------------------------------------------------------------- Activities of Daily Living Details Patient Name: Date of Service: [name], Rivers 02/25/2022 10:30 A M Medical Record Number: [id number] Patient Account Number: [id number] Date of Birth/Sex: Treating RN: 31-May-1975 (47 y.o. Seward Meth Primary Care Pinkie Manger: Sharyn Creamer Other Clinician: Referring Julis Haubner: Treating Aimar Shrewsbury/Extender: Tretha Sciara in Treatment: 0 Activities of Daily Living Items Answer Activities of Daily Living (Please select one for each item) Drive Automobile Completely Able T Medications ake Completely Able Use T elephone Completely Able Care for Appearance Completely Able Use T oilet Completely Able Bath / Shower Completely Able Dress Self Completely Able Feed Self Completely Able Walk Completely Able Get In / Out Bed Completely Able Housework Completely Able Prepare Meals Completely Able Handle Money Completely Able Shop for Self Completely Nathaniel Rivers ([id number]) [id number] Page 2 of 5 Electronic Signature(s) Signed: 02/25/2022 10:54:10 AM By: Rosalio Loud MSN RN CNS WTA Entered By: Rosalio Loud on 02/25/2022  10:54:10 -------------------------------------------------------------------------------- Education Screening Details Patient Name: Date of Service: Nathaniel Rivers 02/25/2022 10:30 A M Medical Record Number: [id number] Patient Account Number: [id number] Date of Birth/Sex: Treating RN: 1975/12/31 (47 y.o. Seward Meth Primary Care Eshika Reckart: Sharyn Creamer Other Clinician: Referring Coralie Stanke: Treating Lexander Tremblay/Extender: Tretha Sciara in Treatment: 0 Learning Preferences/Education Level/Primary Language Learning Preference: Explanation Highest Education Level: High School Preferred Language: English Cognitive Barrier Language Barrier: No Translator Needed: No Memory Deficit: No Emotional Barrier: No Cultural/Religious Beliefs Affecting Medical Care: No Physical Barrier Impaired Vision: No Impaired Hearing: No Decreased Hand dexterity: No Motivation Anxiety Level: Calm Cooperation: Cooperative Education Importance: Acknowledges Need Interest in Health Problems: Asks Questions Perception: Coherent Willingness to Engage in Self-Management High Activities: Readiness to Engage in Self-Management High Activities: Electronic Signature(s) Signed: 02/25/2022 10:54:21 AM By: Rosalio Loud MSN RN CNS WTA Entered By: Rosalio Loud on 02/25/2022 10:54:20 Lizbeth Bark ([id number]) [phone] [id number] Page 3 of 5 -------------------------------------------------------------------------------- Fall Risk Assessment Details Patient Name: Date of Service: Nathaniel Rivers 02/25/2022 10:30 A M Medical Record Number: [id number] Patient Account Number: [id number] Date of Birth/Sex: Treating RN: 23-Sep-1975 (47 y.o. Seward Meth Primary Care Kimimila Tauzin: Sharyn Creamer Other Clinician: Referring Decker Cogdell: Treating Tammi Boulier/Extender: Tretha Sciara in Treatment: 0 Fall Risk Assessment Items Have you had 2 or more falls in the last  12 monthso 0 No Have you had any fall that resulted in injury in the last 12 monthso 0 Yes FALLS RISK SCREEN History of falling - immediate or within 3 months 0 No Secondary diagnosis (Do you have 2 or more medical diagnoseso) 0 No Ambulatory aid None/bed rest/wheelchair/nurse 0 No Crutches/cane/walker 15 Yes Furniture 0 No Intravenous therapy Access/Saline/Heparin Lock 0 No Gait/Transferring Normal/ bed rest/ wheelchair 0 No Weak (short steps with or without shuffle, stooped but able to lift head while walking, may seek 0 No support from furniture) Impaired (short steps with shuffle, may  have difficulty arising from chair, head down, impaired 0 No balance) Mental Status Oriented to own ability 0 Yes Electronic Signature(s) Signed: 02/25/2022 10:54:26 AM By: Rosalio Loud MSN RN CNS WTA Entered By: Rosalio Loud on 02/25/2022 10:54:26 -------------------------------------------------------------------------------- Foot Assessment Details Patient Name: Date of Service: Nathaniel Rivers 02/25/2022 10:30 A M Medical Record Number: [id number] Patient Account Number: [id number] Date of Birth/Sex: Treating RN: [birth year] (47 y.o. Seward Meth Primary Care Merelin Human: Sharyn Creamer Other Clinician: Referring Dellanira Dillow: Treating Suhani Stillion/Extender: Tretha Sciara in Treatment: 0 Foot Assessment Items Site Locations [place], [place] ([id number]) [id number] Page 4 of 5 + = Sensation present, - = Sensation absent, C = Callus, U = Ulcer R = Redness, W = Warmth, M = Maceration, PU = Pre-ulcerative lesion F = Fissure, S = Swelling, D = Dryness Assessment Right: Left: Other Deformity: No No Prior Foot Ulcer: No No Prior Amputation: No No Charcot Joint: No No Ambulatory Status: Ambulatory With Help Assistance Device: Crutches Gait: Buyer, retail Signature(s) Signed: 02/25/2022 10:54:40 AM By: Rosalio Loud MSN RN CNS WTA Entered By:  Rosalio Loud on 02/25/2022 10:54:39 -------------------------------------------------------------------------------- Nutrition Risk Screening Details Patient Name: Date of Service: Nathaniel Rivers 02/25/2022 10:30 A M Medical Record Number: [id number] Patient Account Number: [id number] Date of Birth/Sex: Treating RN: 1975/08/20 (47 y.o. Seward Meth Primary Care Tiasia Weberg: Sharyn Creamer Other Clinician: Referring Kaydince Towles: Treating Brodyn Depuy/Extender: Tretha Sciara in Treatment: 0 Height (in): 69 Weight (lbs): 150 Body Mass Index (BMI): 22.1 Nutrition Risk Screening Items Score Screening NUTRITION RISK SCREEN: I have an illness or condition that made me change the kind and/or amount of food I eat 0 No I eat fewer than two meals per day 0 No I eat few fruits and vegetables, or milk products 0 No I have three or more drinks of beer, liquor or wine almost every day 0 No I have tooth or mouth problems that make it hard for me to eat 0 No EBERT, FORRESTER ([id number]) [id number] Page 5 of 5 I don't always have enough money to buy the food I need 0 No I eat alone most of the time 0 No I take three or more different prescribed or over-the-counter drugs a day 0 No Without wanting to, I have lost or gained 10 pounds in the last six months 0 No I am not always physically able to shop, cook and/or feed myself 0 No Nutrition Protocols Good Risk Protocol 0 No interventions needed Moderate Risk Protocol High Risk Proctocol Risk Level: Good Risk Score: 0 Electronic Signature(s) Signed: 02/25/2022 10:54:32 AM By: Rosalio Loud MSN RN CNS WTA Entered By: Rosalio Loud on 02/25/2022 10:54:32
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
## 2022-02-26 NOTE — Progress Notes (Signed)
KRITHIK, MAPEL ([id number]) [id number] Page 1 of 7 Visit Report for 02/25/2022 Allergy List Details Patient Name: Date of Service: Nathaniel Rivers, Nathaniel Rivers 02/25/2022 10:30 A M Medical Record Number: [id number] Patient Account Number: [id number] Date of Birth/Sex: Treating RN: 1975-10-06 (47 y.o. Seward Meth Primary Care Tamula Morrical: Sharyn Creamer Other Clinician: Referring Everlie Eble: Treating Inda Mcglothen/Extender: Tretha Sciara in Treatment: 0 Allergies Active Allergies No Known Allergies Allergy Notes Electronic Signature(s) Signed: 02/25/2022 11:36:43 AM By: Rosalio Loud MSN RN CNS WTA Previous Signature: 02/25/2022 10:52:42 AM Version By: Rosalio Loud MSN RN CNS WTA Entered By: Rosalio Loud on 02/25/2022 11:36:43 -------------------------------------------------------------------------------- Arrival Information Details Patient Name: Date of Service: Nathaniel Rivers 02/25/2022 10:30 A M Medical Record Number: [id number] Patient Account Number: [id number] Date of Birth/Sex: Treating RN: 1975-06-22 (47 y.o. Seward Meth Primary Care Zakariya Knickerbocker: Sharyn Creamer Other Clinician: Referring Victoire Deans: Treating Idona Stach/Extender: Tretha Sciara in Treatment: 0 Visit Information Patient Arrived: Crutches Arrival Time: 10:23 Accompanied By: self Transfer Assistance: None Patient Identification Verified: Yes Secondary Verification Process Completed: Yes Patient Requires Transmission-Based Precautions: No Patient Has Alerts: Yes Patient Alerts: Not diabetic 325 mg Aspirin Electronic Signature(s) Signed: 02/25/2022 11:36:12 AM By: Rosalio Loud MSN RN CNS Carleene Cooper, Signed: 02/25/2022 11:36:12 AM By: Rosalio Loud MSN RN CNS Vern Claude ([id number]) [id number] Page 2 of 7 Previous Signature: 02/25/2022 10:52:10 AM Version By: Rosalio Loud MSN RN CNS WTA Entered By: Rosalio Loud on 02/25/2022  11:36:11 -------------------------------------------------------------------------------- Clinic Level of Care Assessment Details Patient Name: Date of Service: Nathaniel Rivers 02/25/2022 10:30 A M Medical Record Number: [id number] Patient Account Number: [id number] Date of Birth/Sex: Treating RN: 07-04-1975 (47 y.o. Seward Meth Primary Care Anjali Manzella: Sharyn Creamer Other Clinician: Referring Isadora Delorey: Treating Eliot Popper/Extender: Tretha Sciara in Treatment: 0 Clinic Level of Care Assessment Items TOOL 2 Quantity Score X- 1 0 Use when only an EandM is performed on the INITIAL visit ASSESSMENTS - Nursing Assessment / Reassessment X- 1 20 General Physical Exam (combine w/ comprehensive assessment (listed just below) when performed on new pt. evals) X- 1 25 Comprehensive Assessment (HX, ROS, Risk Assessments, Wounds Hx, etc.) ASSESSMENTS - Wound and Skin A ssessment / Reassessment []  - 0 Simple Wound Assessment / Reassessment - one wound []  - 0 Complex Wound Assessment / Reassessment - multiple wounds []  - 0 Dermatologic / Skin Assessment (not related to wound area) ASSESSMENTS - Ostomy and/or Continence Assessment and Care []  - 0 Incontinence Assessment and Management []  - 0 Ostomy Care Assessment and Management (repouching, etc.) PROCESS - Coordination of Care X - Simple Patient / Family Education for ongoing care 1 15 []  - 0 Complex (extensive) Patient / Family Education for ongoing care X- 1 10 Staff obtains Programmer, systems, Records, T Results / Process Orders est []  - 0 Staff telephones HHA, Nursing Homes / Clarify orders / etc []  - 0 Routine Transfer to another Facility (non-emergent condition) []  - 0 Routine Hospital Admission (non-emergent condition) X- 1 15 New Admissions / Biomedical engineer / Ordering NPWT Apligraf, etc. , []  - 0 Emergency Hospital Admission (emergent condition) X- 1 10 Simple Discharge Coordination []  - 0 Complex  (extensive) Discharge Coordination PROCESS - Special Needs []  - 0 Pediatric / Minor Patient Management []  - 0 Isolation Patient Management []  - 0 Hearing / Language / Visual special needs []  - 0 Assessment of Community assistance (transportation, D/C planning, etc.) []  - 0 Additional assistance /  Altered mentation [name], [name] ([id number]) [id number] Page 3 of 7 []  - 0 Support Surface(s) Assessment (bed, cushion, seat, etc.) INTERVENTIONS - Wound Cleansing / Measurement []  - 0 Wound Imaging (photographs - any number of wounds) []  - 0 Wound Tracing (instead of photographs) []  - 0 Simple Wound Measurement - one wound []  - 0 Complex Wound Measurement - multiple wounds []  - 0 Simple Wound Cleansing - one wound []  - 0 Complex Wound Cleansing - multiple wounds INTERVENTIONS - Wound Dressings []  - 0 Small Wound Dressing one or multiple wounds []  - 0 Medium Wound Dressing one or multiple wounds []  - 0 Large Wound Dressing one or multiple wounds []  - 0 Application of Medications - injection INTERVENTIONS - Miscellaneous []  - 0 External ear exam []  - 0 Specimen Collection (cultures, biopsies, blood, body fluids, etc.) []  - 0 Specimen(s) / Culture(s) sent or taken to Lab for analysis []  - 0 Patient Transfer (multiple staff / Civil Service fast streamer / Similar devices) []  - 0 Simple Staple / Suture removal (25 or less) []  - 0 Complex Staple / Suture removal (26 or more) []  - 0 Hypo / Hyperglycemic Management (close monitor of Blood Glucose) X- 1 15 Ankle / Brachial Index (ABI) - do not check if billed separately Has the patient been seen at the hospital within the last three years: Yes Total Score: 110 Level Of Care: New/Established - Level 3 Electronic Signature(s) Signed: 02/25/2022 4:47:59 PM By: Rosalio Loud MSN RN CNS WTA Entered By: Rosalio Loud on 02/25/2022  11:29:18 -------------------------------------------------------------------------------- Encounter Discharge Information Details Patient Name: Date of Service: Nathaniel Rivers 02/25/2022 10:30 A M Medical Record Number: [id number] Patient Account Number: [id number] Date of Birth/Sex: Treating RN: Jun 12, 1975 (47 y.o. Seward Meth Primary Care Kayela Humphres: Sharyn Creamer Other Clinician: Referring Ellanie Oppedisano: Treating Aoi Kouns/Extender: Tretha Sciara in Treatment: 0 Encounter Discharge Information Items Discharge Condition: Stable Ambulatory Status: Crutches Discharge Destination: Home Transportation: Private Auto Accompanied By: self Schedule Follow-up Appointment: No Clinical Summary of Care: GRANTLAND, WANT ([id number]) [id number] Page 4 of 7 Electronic Signature(s) Signed: 02/25/2022 11:42:50 AM By: Rosalio Loud MSN RN CNS WTA Entered By: Rosalio Loud on 02/25/2022 11:42:50 -------------------------------------------------------------------------------- Lower Extremity Assessment Details Patient Name: Date of Service: WINTHROP, SHANNAHAN 02/25/2022 10:30 A M Medical Record Number: [id number] Patient Account Number: [id number] Date of Birth/Sex: Treating RN: 1975-05-24 (47 y.o. Seward Meth Primary Care Yancarlos Berthold: Sharyn Creamer Other Clinician: Referring Lachell Rochette: Treating Traves Majchrzak/Extender: Tretha Sciara in Treatment: 0 Edema Assessment Assessed: Shirlyn Goltz: No] [Right: No] Edema: [Left: N] [Right: o] Vascular Assessment Pulses: Popliteal Doppler Audible: [Right:Yes] Blood Pressure: Brachial: [Right:88] Ankle: [Right:Dorsalis Pedis: 110 1.25] Electronic Signature(s) Signed: 02/25/2022 11:36:37 AM By: Rosalio Loud MSN RN CNS WTA Previous Signature: 02/25/2022 10:52:38 AM Version By: Rosalio Loud MSN RN CNS WTA Entered By: Rosalio Loud on 02/25/2022  11:36:36 -------------------------------------------------------------------------------- Multi Wound Chart Details Patient Name: Date of Service: Nathaniel Fall, JUELL 02/25/2022 10:30 A M Medical Record Number: [id number] Patient Account Number: [id number] Date of Birth/Sex: Treating RN: 03-16-1975 (47 y.o. Seward Meth Primary Care Ciela Mahajan: Sharyn Creamer Other Clinician: Referring Rokia Bosket: Treating Feliciana Narayan/Extender: Tretha Sciara in Treatment: 0 Vital Signs Height(in): 69 Pulse(bpm): 62 Weight(lbs): 150 Blood Pressure(mmHg): 119/69 GERAMY, LAMORTE ([id number]) [phone].pdf Page 5 of 7 Body Mass Index(BMI): 22.1 Temperature(F): 98.3 Respiratory Rate(breaths/min): 16 [Treatment Notes:Wound Assessments Treatment Notes] Electronic Signature(s) Signed: 02/25/2022 4:47:59 PM By: Rosalio Loud MSN RN CNS WTA  Entered By: Rosalio Loud on 02/25/2022 11:22:41 -------------------------------------------------------------------------------- Custar Details Patient Name: Date of Service: DUB, MACLELLAN 02/25/2022 10:30 A M Medical Record Number: [id number] Patient Account Number: [id number] Date of Birth/Sex: Treating RN: February 19, 1975 (47 y.o. Seward Meth Primary Care Amiel Sharrow: Sharyn Creamer Other Clinician: Referring Saharra Santo: Treating Angeligue Bowne/Extender: Tretha Sciara in Treatment: 0 Active Inactive Electronic Signature(s) Signed: 02/25/2022 11:42:12 AM By: Rosalio Loud MSN RN CNS WTA Entered By: Rosalio Loud on 02/25/2022 11:42:12 -------------------------------------------------------------------------------- Pain Assessment Details Patient Name: Date of Service: FLORA, RATZ 02/25/2022 10:30 A M Medical Record Number: [id number] Patient Account Number: [id number] Date of Birth/Sex: Treating RN: Aug 06, 1975 (47 y.o. Seward Meth Primary Care Colbey Wirtanen: Sharyn Creamer Other Clinician: Referring  Waverly Paone: Treating Barnabas Henriques/Extender: Tretha Sciara in Treatment: 0 Active Problems Location of Pain Severity and Description of Pain Patient Has Paino Yes Site Locations Pain LocationGWYN, MEHRING ([id number]) [id number] Page 6 of 7 Pain Location: Pain in Ulcers With Dressing Change: Yes Rate the pain. Current Pain Level: 5 Worst Pain Level: 9 Least Pain Level: 1 Tolerable Pain Level: 3 Character of Pain Describe the Pain: Aching, Tender, Throbbing Pain Management and Medication Current Pain Management: Medication: No Cold Application: No Rest: No Massage: No Activity: No T.E.N.S.: No Heat Application: No Leg drop or elevation: No Is the Current Pain Management Adequate: Inadequate How does your wound impact your activities of daily livingo Sleep: Yes Electronic Signature(s) Signed: 02/25/2022 11:36:19 AM By: Rosalio Loud MSN RN CNS WTA Previous Signature: 02/25/2022 10:52:16 AM Version By: Rosalio Loud MSN RN CNS WTA Entered By: Rosalio Loud on 02/25/2022 11:36:18 -------------------------------------------------------------------------------- Patient/Caregiver Education Details Patient Name: Date of Service: Nathaniel Rivers 2/5/2024andnbsp10:30 [place] Record Number: [id number] Patient Account Number: [id number] Date of Birth/Gender: Treating RN: Nov 22, 1975 (47 y.o. Seward Meth Primary Care Physician: Sharyn Creamer Other Clinician: Referring Physician: Treating Physician/Extender: Tretha Sciara in Treatment: 0 Education Assessment Education Provided To: Patient Education Topics Provided Electronic Signature(s) Signed: 02/25/2022 4:47:59 PM By: Rosalio Loud MSN RN CNS WTA Entered By: Rosalio Loud on 02/25/2022 11:29:58 Lizbeth Bark ([id number]) [id number] Page 7 of 7 -------------------------------------------------------------------------------- Vitals  Details Patient Name: Date of Service: JAVARIOUS, ELSAYED 02/25/2022 10:30 A M Medical Record Number: [id number] Patient Account Number: [id number] Date of Birth/Sex: Treating RN: [birth year] (47 y.o. Seward Meth Primary Care Tomi Paddock: Sharyn Creamer Other Clinician: Referring Angela Vazguez: Treating Katana Berthold/Extender: Tretha Sciara in Treatment: 0 Vital Signs Time Taken: 10:27 Temperature (F): 98.3 Height (in): 69 Pulse (bpm): 62 Source: Stated Respiratory Rate (breaths/min): 16 Weight (lbs): 150 Blood Pressure (mmHg): 119/69 Source: Stated Reference Range: 80 - 120 mg / dl Body Mass Index (BMI): 22.1 Electronic Signature(s) Signed: 02/25/2022 11:36:24 AM By: Rosalio Loud MSN RN CNS WTA Previous Signature: 02/25/2022 10:52:21 AM Version By: Rosalio Loud MSN RN CNS WTA Entered By: Rosalio Loud on 02/25/2022 11:36:24
